# Patient Record
Sex: Male | Born: 1980 | Race: White | Hispanic: No | Marital: Married | Smoking: Never smoker
Health system: Southern US, Community
[De-identification: ages and names within clinical notes are randomized; demographics above are authoritative.]

## PROBLEM LIST (undated history)

## (undated) DIAGNOSIS — J45909 Unspecified asthma, uncomplicated: Secondary | ICD-10-CM

## (undated) HISTORY — DX: Unspecified asthma, uncomplicated: J45.909

## (undated) HISTORY — PX: TONSILLECTOMY AND ADENOIDECTOMY: SUR1326

---

## 2008-06-18 HISTORY — PX: ANTERIOR CRUCIATE LIGAMENT REPAIR: SHX115

## 2012-10-01 ENCOUNTER — Encounter: Payer: Self-pay | Admitting: Family Medicine

## 2012-10-01 ENCOUNTER — Ambulatory Visit (INDEPENDENT_AMBULATORY_CARE_PROVIDER_SITE_OTHER): Payer: BC Managed Care – PPO | Admitting: Family Medicine

## 2012-10-01 VITALS — BP 130/80 | HR 80 | Temp 98.7°F | Resp 12 | Ht 70.25 in | Wt 273.0 lb

## 2012-10-01 DIAGNOSIS — D802 Selective deficiency of immunoglobulin A [IgA]: Secondary | ICD-10-CM

## 2012-10-01 DIAGNOSIS — J45909 Unspecified asthma, uncomplicated: Secondary | ICD-10-CM

## 2012-10-01 DIAGNOSIS — F411 Generalized anxiety disorder: Secondary | ICD-10-CM

## 2012-10-01 DIAGNOSIS — J029 Acute pharyngitis, unspecified: Secondary | ICD-10-CM

## 2012-10-01 DIAGNOSIS — J4520 Mild intermittent asthma, uncomplicated: Secondary | ICD-10-CM | POA: Insufficient documentation

## 2012-10-01 MED ORDER — SERTRALINE HCL 50 MG PO TABS: 50.0000 mg | ORAL_TABLET | Freq: Every day | ORAL | Status: DC

## 2012-10-01 MED ORDER — ALBUTEROL SULFATE HFA 108 (90 BASE) MCG/ACT IN AERS: 2.0000 | INHALATION_SPRAY | RESPIRATORY_TRACT | Status: DC | PRN

## 2012-10-01 NOTE — Patient Instructions (Addendum)
Viral Pharyngitis  Viral pharyngitis is a viral infection that produces redness, pain, and swelling (inflammation) of the throat. It can spread from person to person (contagious).  CAUSES  Viral pharyngitis is caused by inhaling a large amount of certain germs called viruses. Many different viruses cause viral pharyngitis.  SYMPTOMS  Symptoms of viral pharyngitis include:   Sore throat.   Tiredness.   Stuffy nose.   Low-grade fever.   Congestion.   Cough.  TREATMENT  Treatment includes rest, drinking plenty of fluids, and the use of over-the-counter medication (approved by your caregiver).  HOME CARE INSTRUCTIONS    Drink enough fluids to keep your urine clear or pale yellow.   Eat soft, cold foods such as ice cream, frozen ice pops, or gelatin dessert.   Gargle with warm salt water (1 tsp salt per 1 qt of water).   If over age 7, throat lozenges may be used safely.   Only take over-the-counter or prescription medicines for pain, discomfort, or fever as directed by your caregiver. Do not take aspirin.  To help prevent spreading viral pharyngitis to others, avoid:   Mouth-to-mouth contact with others.   Sharing utensils for eating and drinking.   Coughing around others.  SEEK MEDICAL CARE IF:    You are better in a few days, then become worse.   You have a fever or pain not helped by pain medicines.   There are any other changes that concern you.  Document Released: 03/14/2005 Document Revised: 08/27/2011 Document Reviewed: 08/10/2010  ExitCare Patient Information 2013 [employer].

## 2012-10-01 NOTE — Progress Notes (Signed)
  Subjective:    Patient ID: Jeffrey [name], male    DOB: Aug 09, 1980, 32 y.o.   MRN: [id number]  HPI Patient here to establish care History of reported mild intermittent asthma. Rarely takes Proventil. Requesting refill. He is a nonsmoker. Takes no regular medications. 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 Had more frequent infections in childhood  Acute issue of history of URI symptoms. Possible low-grade fever last night with chills. Had some nasal congestion, dry cough, sore throat, malaise, and body aches. Denies any nausea or vomiting  Patient relates frequent anxiety symptoms. Question related to job stress. Denies any depression issues. He has some recent episodes of acute anxiety with lightheadedness, palpitations and occasional sensation of chest tightness. This has never been related to exertion. He frequently runs 2-3 miles per day without any difficulty whatsoever. No history of syncope. Starting to have some stress issues affecting sleep. No excessive caffeine use. No regular alcohol use. Nonsmoker.  Family history significant for father died at 45 of unknown cause. Possibly related to MI versus aneurysm. Father also had prostate cancer history.   Patient is single. Occasional cigar use. Occasional alcohol use as above. Real estate attorney   Review of Systems  Constitutional: Negative for fever, chills, fatigue and unexpected weight change.  HENT: Positive for congestion and sinus pressure.   Respiratory: Negative for cough and shortness of breath.   Cardiovascular: Positive for palpitations. Negative for leg swelling.  Gastrointestinal: Negative for abdominal pain.       Objective:   Physical Exam  Constitutional: He appears well-developed and well-nourished.  HENT:  Right Ear: External ear normal.  Left Ear: External ear normal.  Minimal posterior pharynx erythema. No exudate.  Neck: Neck supple. No thyromegaly present.  Cardiovascular: Normal rate and regular  rhythm.   No murmur heard. Pulmonary/Chest: Effort normal and breath sounds normal. No respiratory distress. He has no wheezes. He has no rales.  Musculoskeletal: He exhibits no edema.  Lymphadenopathy:    He has no cervical adenopathy.  Skin: No rash noted.          Assessment & Plan:  #1 viral pharyngitis. Rapid strep negative. Treat symptomatically #2 mild intermittent asthma. Refill albuterol for as needed use #3 anxiety. Question situational. Doubt panic attacks. We discussed options. We've recommended regular exercise which he is currently doing. We discussed possible counseling which he's not interested in. He agreed to trial of sertraline 50 mg one daily and reassess 4 weeks. Reviewed possible side effects

## 2012-10-28 ENCOUNTER — Telehealth: Payer: Self-pay | Admitting: Family Medicine

## 2012-10-28 NOTE — Telephone Encounter (Signed)
Pt would like referral to be tested for ADD.  Pt will then make follow up appt w/ you.

## 2012-10-29 NOTE — Telephone Encounter (Signed)
Please advise 

## 2012-10-30 NOTE — Telephone Encounter (Signed)
Pt following up on request. Pls advise

## 2012-10-30 NOTE — Telephone Encounter (Signed)
I would recommend he get follow up with Dr Caralyn Guile (confirm that he is still doing ADD testing first)

## 2012-10-31 NOTE — Telephone Encounter (Signed)
[facility] does do ADD testing, it's all done on a computer and takes about 30 min.  This testing is only done ar the [place] location.  I was told the patient can call to schedule this testing, a referral can be done, but doesn't need to be.  I left pt a message on his personally identified VM, gave him their phone number.

## 2012-11-07 ENCOUNTER — Ambulatory Visit (INDEPENDENT_AMBULATORY_CARE_PROVIDER_SITE_OTHER): Payer: BC Managed Care – PPO | Admitting: Licensed Clinical Social Worker

## 2012-11-07 ENCOUNTER — Ambulatory Visit: Payer: BC Managed Care – PPO | Admitting: Family Medicine

## 2012-11-07 DIAGNOSIS — F988 Other specified behavioral and emotional disorders with onset usually occurring in childhood and adolescence: Secondary | ICD-10-CM

## 2012-11-14 ENCOUNTER — Encounter: Payer: Self-pay | Admitting: Family Medicine

## 2012-11-14 ENCOUNTER — Ambulatory Visit (INDEPENDENT_AMBULATORY_CARE_PROVIDER_SITE_OTHER): Payer: BC Managed Care – PPO | Admitting: Family Medicine

## 2012-11-14 VITALS — BP 140/84 | Temp 97.8°F | Wt 289.0 lb

## 2012-11-14 DIAGNOSIS — F988 Other specified behavioral and emotional disorders with onset usually occurring in childhood and adolescence: Secondary | ICD-10-CM | POA: Insufficient documentation

## 2012-11-14 MED ORDER — AMPHETAMINE-DEXTROAMPHETAMINE 20 MG PO TABS: 20.0000 mg | ORAL_TABLET | Freq: Two times a day (BID) | ORAL | Status: DC

## 2012-11-14 NOTE — Progress Notes (Signed)
  Subjective:    Patient ID: Jeffrey [name], male    DOB: 1980/11/05, 32 y.o.   MRN: [id number]  HPI Patient seen for followup ADD evaluation. Patient had recent evaluation which showed fairly severe ADHD. Patient completed law school recently that had issues all through school with focus and easy distractibility Frequently forgets things. Difficulty completing tasks. Difficulty staying focused. Has there been treated with medication. Starting had progressive job difficulties because of focus challenges  Currently takes albuterol as needed for mild intermittent asthma but no other medications. Never started sertraline and he felt a lot of his anxiety was related to ADD which had not been treated  Past Medical History  Diagnosis Date  . Asthma    Past Surgical History  Procedure Laterality Date  . Tonsillectomy and adenoidectomy      as a child  . Anterior cruciate ligament repair Right 2010    reports that he has never smoked. He does not have any smokeless tobacco history on file. His alcohol and drug histories are not on file. family history includes Cancer in his father; Heart disease in his father, maternal grandfather, maternal grandmother, paternal grandfather, and paternal grandmother; and Hypertension in his father. No Known Allergies    Review of Systems  Constitutional: Negative for fatigue.  Eyes: Negative for visual disturbance.  Respiratory: Negative for cough, chest tightness and shortness of breath.   Cardiovascular: Negative for chest pain, palpitations and leg swelling.  Neurological: Negative for dizziness, syncope, weakness, light-headedness and headaches.       Objective:   Physical Exam  Constitutional: He appears well-developed and well-nourished.  Cardiovascular: Normal rate and regular rhythm.  Exam reveals no gallop.   No murmur heard. Pulmonary/Chest: Effort normal and breath sounds normal. No respiratory distress. He has no wheezes. He has no  rales.  Psychiatric: He has a normal mood and affect. His behavior is normal.          Assessment & Plan:  ADD. Long discussion with patient regarding options. We agreed to trial of Adderall 20 mg twice a day. Reviewed possible side effects. Discussed controlled nature. Reassess one month

## 2012-11-14 NOTE — Patient Instructions (Addendum)
Attention Deficit Hyperactivity Disorder Attention deficit hyperactivity disorder (ADHD) is a problem with behavior issues based on the way the brain functions (neurobehavioral disorder). It is a common reason for behavior and academic problems in school. CAUSES  The cause of ADHD is unknown in most cases. It may run in families. It sometimes can be associated with learning disabilities and other behavioral problems. SYMPTOMS  There are 3 types of ADHD. The 3 types and some of the symptoms include:  Inattentive  Gets bored or distracted easily.  Loses or forgets things. Forgets to hand in homework.  Has trouble organizing or completing tasks.  Difficulty staying on task.  An inability to organize daily tasks and school work.  Leaving projects, chores, or homework unfinished.  Trouble paying attention or responding to details. Careless mistakes.  Difficulty following directions. Often seems like is not listening.  Dislikes activities that require sustained attention (like chores or homework).  Hyperactive-impulsive  Feels like it is impossible to sit still or stay in a seat. Fidgeting with hands and feet.  Trouble waiting turn.  Talking too much or out of turn. Interruptive.  Speaks or acts impulsively.  Aggressive, disruptive behavior.  Constantly busy or on the go, noisy.  Combined  Has symptoms of both of the above. Often children with ADHD feel discouraged about themselves and with school. They often perform well below their abilities in school. These symptoms can cause problems in home, school, and in relationships with peers. As children get older, the excess motor activities can calm down, but the problems with paying attention and staying organized persist. Most children do not outgrow ADHD but with good treatment can learn to cope with the symptoms. DIAGNOSIS  When ADHD is suspected, the diagnosis should be made by professionals trained in ADHD.  Diagnosis will  include:  Ruling out other reasons for the child's behavior.  The caregivers will check with the child's school and check their medical records.  They will talk to teachers and parents.  Behavior rating scales for the child will be filled out by those dealing with the child on a daily basis. A diagnosis is made only after all information has been considered. TREATMENT  Treatment usually includes behavioral treatment often along with medicines. It may include stimulant medicines. The stimulant medicines decrease impulsivity and hyperactivity and increase attention. Other medicines used include antidepressants and certain blood pressure medicines. Most experts agree that treatment for ADHD should address all aspects of the child's functioning. Treatment should not be limited to the use of medicines alone. Treatment should include structured classroom management. The parents must receive education to address rewarding good behavior, discipline, and limit-setting. Tutoring or behavioral therapy or both should be available for the child. If untreated, the disorder can have long-term serious effects into adolescence and adulthood. HOME CARE INSTRUCTIONS   Often with ADHD there is a lot of frustration among the family in dealing with the illness. There is often blame and anger that is not warranted. This is a life long illness. There is no way to prevent ADHD. In many cases, because the problem affects the family as a whole, the entire family may need help. A therapist can help the family find better ways to handle the disruptive behaviors and promote change. If the child is young, most of the therapist's work is with the parents. Parents will learn techniques for coping with and improving their child's behavior. Sometimes only the child with the ADHD needs counseling. Your caregivers can help   you make these decisions.  Children with ADHD may need help in organizing. Some helpful tips include:  Keep  routines the same every day from wake-up time to bedtime. Schedule everything. This includes homework and playtime. This should include outdoor and indoor recreation. Keep the schedule on the refrigerator or a bulletin board where it is frequently seen. Mark schedule changes as far in advance as possible.  Have a place for everything and keep everything in its place. This includes clothing, backpacks, and school supplies.  Encourage writing down assignments and bringing home needed books.  Offer your child a well-balanced diet. Breakfast is especially important for school performance. Children should avoid drinks with caffeine including:  Soft drinks.  Coffee.  Tea.  However, some older children (adolescents) may find these drinks helpful in improving their attention.  Children with ADHD need consistent rules that they can understand and follow. If rules are followed, give small rewards. Children with ADHD often receive, and expect, criticism. Look for good behavior and praise it. Set realistic goals. Give clear instructions. Look for activities that can foster success and self-esteem. Make time for pleasant activities with your child. Give lots of affection.  Parents are their children's greatest advocates. Learn as much as possible about ADHD. This helps you become a stronger and better advocate for your child. It also helps you educate your child's teachers and instructors if they feel inadequate in these areas. Parent support groups are often helpful. A national group with local chapters is called CHADD (Children and Adults with Attention Deficit Hyperactivity Disorder). PROGNOSIS  There is no cure for ADHD. Children with the disorder seldom outgrow it. Many find adaptive ways to accommodate the ADHD as they mature. SEEK MEDICAL CARE IF:  Your child has repeated muscle twitches, cough or speech outbursts.  Your child has sleep problems.  Your child has a marked loss of  appetite.  Your child develops depression.  Your child has new or worsening behavioral problems.  Your child develops dizziness.  Your child has a racing heart.  Your child has stomach pains.  Your child develops headaches. Document Released: 05/25/2002 Document Revised: 08/27/2011 Document Reviewed: 01/05/2008 ExitCare Patient Information 2014 [employer].  

## 2012-12-12 ENCOUNTER — Encounter: Payer: Self-pay | Admitting: Family Medicine

## 2012-12-12 ENCOUNTER — Ambulatory Visit (INDEPENDENT_AMBULATORY_CARE_PROVIDER_SITE_OTHER): Payer: BC Managed Care – PPO | Admitting: Family Medicine

## 2012-12-12 VITALS — BP 120/80 | HR 84 | Temp 98.4°F | Resp 20 | Wt 272.0 lb

## 2012-12-12 DIAGNOSIS — F988 Other specified behavioral and emotional disorders with onset usually occurring in childhood and adolescence: Secondary | ICD-10-CM

## 2012-12-12 NOTE — Progress Notes (Signed)
  Subjective:    Patient ID: Jeffrey [name], male    DOB: [birth year], 32 y.o.   MRN: [id number]  HPI Followup attention deficit disorder We initiated Adderall 20 mg twice daily last visit Patient's had tremendous improvement in terms particularly of job functioning. More organized. More fissure with work. Less distractibility. Denies any headaches, insomnia, or any other side effects He feels like he is mostly getting about 3-4 hour duration medication. No major appetite suppression.  He has lost some weight but he states he is attempting to lose weight with diet and exercise changes   Review of Systems  Respiratory: Negative for shortness of breath.   Cardiovascular: Negative for chest pain.  Neurological: Negative for headaches.       Objective:   Physical Exam  Constitutional: He appears well-developed and well-nourished.  Cardiovascular: Normal rate and regular rhythm.  Exam reveals no gallop.   No murmur heard. Pulmonary/Chest: Effort normal and breath sounds normal. No respiratory distress. He has no wheezes. He has no rales.          Assessment & Plan:  Attention deficit disorder. Improved. Continue Adderall 20 mg twice daily. At refills will prepare for 3 months

## 2012-12-15 ENCOUNTER — Ambulatory Visit: Payer: BC Managed Care – PPO | Admitting: Family Medicine

## 2012-12-16 ENCOUNTER — Telehealth: Payer: Self-pay | Admitting: Family Medicine

## 2012-12-16 NOTE — Telephone Encounter (Signed)
Pt needs new rx generic adderall 20 mg °

## 2012-12-16 NOTE — Telephone Encounter (Signed)
Please advise 

## 2012-12-17 MED ORDER — AMPHETAMINE-DEXTROAMPHET ER 20 MG PO CP24: 20.0000 mg | ORAL_CAPSULE | Freq: Two times a day (BID) | ORAL | Status: DC

## 2012-12-17 MED ORDER — AMPHETAMINE-DEXTROAMPHETAMINE 20 MG PO TABS: 20.0000 mg | ORAL_TABLET | Freq: Two times a day (BID) | ORAL | Status: DC

## 2012-12-17 NOTE — Telephone Encounter (Signed)
Refill for 3 months. 

## 2013-01-28 ENCOUNTER — Telehealth: Payer: Self-pay | Admitting: Family Medicine

## 2013-01-28 MED ORDER — AMPHETAMINE-DEXTROAMPHETAMINE 20 MG PO TABS: ORAL_TABLET | ORAL | Status: DC

## 2013-01-28 MED ORDER — AMPHETAMINE-DEXTROAMPHETAMINE 20 MG PO TABS: 20.0000 mg | ORAL_TABLET | Freq: Two times a day (BID) | ORAL | Status: DC

## 2013-01-28 NOTE — Telephone Encounter (Signed)
Will be at the front for pick up

## 2013-01-28 NOTE — Telephone Encounter (Signed)
Pt's script for ADDERALL was written incorrectly. Pt received only one correct script amphetamine-dextroamphetamine (ADDERALL) 20 MG tablet Pt received 2 scripts for: amphetamine-dextroamphetamine (ADDERALL XR) 20 MG 24 hr   Pt does not take this.  Pt did not realize it was wrong until he went today to pharm, so he has no pills left. Pt would like to know if he could get today, since he is out.

## 2013-02-26 ENCOUNTER — Telehealth: Payer: Self-pay | Admitting: Family Medicine

## 2013-02-26 NOTE — Telephone Encounter (Signed)
Patient's prior auth for Adderall XR 20mg  twice daily has been denied. Per his insurance, these are the covered options:  - a once daily dose of Adderall XR at a higher strength - twice daily dosing of Adderall XR if it is a combination of two capsules of different strengths - twice daily dosing of Adderall IR  Please advise.

## 2013-02-27 NOTE — Telephone Encounter (Signed)
We just wrote for the adderall 20 mg bid for 3 months in August.  We do have his med correct by our chart.

## 2013-02-27 NOTE — Telephone Encounter (Signed)
We will need to discuss rx options with patient.  Let him know INSURANCE is not covering his current med and we can get back in to offer changes.

## 2013-02-27 NOTE — Telephone Encounter (Signed)
I spoke with Jeffrey [name]. He states that he is not on Adderall XR 20mg  BID; that it was a mix up earlier with [place] and that he brought those rx back & obtained the rx for the IR. For some reason, the pharmacy still has it as XR. I will call them as well. However, patient states current rx is: Adderall IR 20mg , BID.

## 2013-04-23 ENCOUNTER — Telehealth: Payer: Self-pay | Admitting: Family Medicine

## 2013-04-23 NOTE — Telephone Encounter (Signed)
May refill by 04-29-13-which makes 3 months.

## 2013-04-23 NOTE — Telephone Encounter (Signed)
Pt needs new rx generic adderall 20 mg #60. Pt would like rxs for next 3 month

## 2013-04-23 NOTE — Telephone Encounter (Signed)
Last refill 01/28/13 #60 2 refill Last visit 12/12/12

## 2013-04-24 NOTE — Telephone Encounter (Signed)
Pt is aware rx is not ready

## 2013-04-29 MED ORDER — AMPHETAMINE-DEXTROAMPHETAMINE 20 MG PO TABS: 20.0000 mg | ORAL_TABLET | Freq: Two times a day (BID) | ORAL | Status: DC

## 2013-04-29 MED ORDER — AMPHETAMINE-DEXTROAMPHETAMINE 20 MG PO TABS: ORAL_TABLET | ORAL | Status: DC

## 2013-04-29 NOTE — Telephone Encounter (Signed)
Left message on pt VM that his RX is ready for pick up

## 2013-05-04 ENCOUNTER — Encounter (HOSPITAL_COMMUNITY): Payer: Self-pay | Admitting: Emergency Medicine

## 2013-05-04 ENCOUNTER — Emergency Department (HOSPITAL_COMMUNITY)
Admission: EM | Admit: 2013-05-04 | Discharge: 2013-05-04 | Disposition: A | Discharge status: Home / Self Care | Payer: BC Managed Care – PPO | Attending: Emergency Medicine | Admitting: Emergency Medicine

## 2013-05-04 ENCOUNTER — Emergency Department (HOSPITAL_COMMUNITY): Payer: BC Managed Care – PPO

## 2013-05-04 DIAGNOSIS — Z8781 Personal history of (healed) traumatic fracture: Secondary | ICD-10-CM | POA: Insufficient documentation

## 2013-05-04 DIAGNOSIS — R0609 Other forms of dyspnea: Secondary | ICD-10-CM | POA: Insufficient documentation

## 2013-05-04 DIAGNOSIS — J45909 Unspecified asthma, uncomplicated: Secondary | ICD-10-CM | POA: Insufficient documentation

## 2013-05-04 DIAGNOSIS — S0993XA Unspecified injury of face, initial encounter: Secondary | ICD-10-CM | POA: Insufficient documentation

## 2013-05-04 DIAGNOSIS — Y9389 Activity, other specified: Secondary | ICD-10-CM | POA: Insufficient documentation

## 2013-05-04 DIAGNOSIS — S0992XA Unspecified injury of nose, initial encounter: Secondary | ICD-10-CM

## 2013-05-04 DIAGNOSIS — R0989 Other specified symptoms and signs involving the circulatory and respiratory systems: Secondary | ICD-10-CM | POA: Insufficient documentation

## 2013-05-04 DIAGNOSIS — Z79899 Other long term (current) drug therapy: Secondary | ICD-10-CM | POA: Insufficient documentation

## 2013-05-04 DIAGNOSIS — W64XXXA Exposure to other animate mechanical forces, initial encounter: Secondary | ICD-10-CM | POA: Insufficient documentation

## 2013-05-04 DIAGNOSIS — Y92009 Unspecified place in unspecified non-institutional (private) residence as the place of occurrence of the external cause: Secondary | ICD-10-CM | POA: Insufficient documentation

## 2013-05-04 NOTE — ED Notes (Signed)
Patient was hit in the nose by his dog at home. Trouble breathing n the left nostril.

## 2013-05-04 NOTE — ED Provider Notes (Signed)
CSN: [id number]     Arrival date & time 05/04/13  4098 History  This chart was scribed for Santiago Glad, PA working with Richardean Canal, MD by Quintella Reichert, ED Scribe. This patient was seen in room WTR9/WTR9 and the patient's care was started at 7:52 PM.   Chief Complaint  Patient presents with  . Facial Injury    The history is provided by the patient. No language interpreter was used.    HPI Comments: Jeffrey [name] is a 32 y.o. male who presents to the Emergency Department complaining of a facial injury sustained pta when he was hit in the nose by the crown of his dog's head.  Pt states that he was bleeding initially but this has since resolved.  He denies LOC in the injury.  He states that he is having some trouble breathing out of the left nostril "almost like sinus congestion."  He also notes constant moderate pain in the nose.  Pt states that he fractured his skull as a child and wants to make sure there he has not sustained any bony injuries. He has not taken anything for pain prior to arrival   Past Medical History  Diagnosis Date  . Asthma     Past Surgical History  Procedure Laterality Date  . Tonsillectomy and adenoidectomy      as a child  . Anterior cruciate ligament repair Right 2010    Family History  Problem Relation Age of Onset  . Cancer Father     prostate  . Heart disease Father   . Hypertension Father   . Heart disease Maternal Grandmother   . Heart disease Maternal Grandfather   . Heart disease Paternal Grandmother   . Heart disease Paternal Grandfather     History  Substance Use Topics  . Smoking status: Never Smoker   . Smokeless tobacco: Not on file  . Alcohol Use: Yes     Comment: occasional     Review of Systems A complete 10 system review of systems was obtained and all systems are negative except as noted in the HPI and PMH.    Allergies  Phenergan  Home Medications   Current Outpatient Rx  Name  Route  Sig  Dispense  Refill   . amphetamine-dextroamphetamine (ADDERALL) 20 MG tablet      Take 1 tablet (20 mg total) by mouth 2 (two) times daily... May refill in two month.   60 tablet   0   . Multiple Vitamins-Minerals (MENS MULTI VITAMIN & MINERAL PO)   Oral   Take by mouth daily.         . naproxen sodium (ANAPROX) 220 MG tablet   Oral   Take 220 mg by mouth 2 (two) times daily with a meal.         . zinc gluconate 50 MG tablet   Oral   Take 50 mg by mouth daily.         Marland Kitchen albuterol (PROVENTIL HFA;VENTOLIN HFA) 108 (90 BASE) MCG/ACT inhaler   Inhalation   Inhale 2 puffs into the lungs every 4 (four) hours as needed for wheezing.   1 Inhaler   2    BP 149/89  Pulse 86  Temp(Src) 98.2 F (36.8 C) (Oral)  Wt 262 lb (118.842 kg)  SpO2 97%  Physical Exam  Nursing note and vitals reviewed. Constitutional: He appears well-developed and well-nourished.  HENT:  Head: Normocephalic and atraumatic.  Nose: No epistaxis.  Mouth/Throat: Oropharynx is clear  and moist. No oropharyngeal exudate.  Tenderness to palpation of the nasal bones. Nares patent bilaterally.  No swelling, bruising, or obvious deformity of the nose.  Eyes: EOM are normal. Pupils are equal, round, and reactive to light.  Neck: Normal range of motion. Neck supple.  Cardiovascular: Normal rate, regular rhythm and normal heart sounds.   No murmur heard. Pulmonary/Chest: Effort normal and breath sounds normal. He has no wheezes.  Musculoskeletal: Normal range of motion.  Neurological: He is alert.  Skin: Skin is warm and dry.  Psychiatric: He has a normal mood and affect. His behavior is normal.    ED Course  Procedures (including critical care time)  DIAGNOSTIC STUDIES: Oxygen Saturation is 97% on room air, normal by my interpretation.    COORDINATION OF CARE: 7:56 PM-Informed pt that imaging is negative for fracture.  Discussed treatment plan which includes ice and anti-inflammatories as needed with pt at bedside and pt  agreed to plan.    Labs Review Labs Reviewed - No data to display  Imaging Review Dg Facial Bones 1-2 Views  05/04/2013   CLINICAL DATA:  Injured nose.  EXAM: FACIAL BONES - 1-2 VIEW  COMPARISON:  None.  FINDINGS: No obvious nasal bone fractures identified. The paranasal sinuses are clear and the other facial bones appear normal.  IMPRESSION: No definite plain film findings for acute nasal bone fracture.   Electronically Signed   By: Loralie Champagne M.D.   On: 05/04/2013 19:41    EKG Interpretation   None       MDM  No diagnosis found. Patient presenting with nasal pain after his dog hit him in the nose with its head.  Xray of facial bones negative.  Nares patent.  No epistaxis.  Feel that the patient is stable for discharge.    I personally performed the services described in this documentation, which was scribed in my presence. The recorded information has been reviewed and is accurate.    Santiago Glad, PA-C 05/05/13 2325

## 2013-05-09 NOTE — ED Provider Notes (Signed)
Medical screening examination/treatment/procedure(s) were performed by non-physician practitioner and as supervising physician I was immediately available for consultation/collaboration.  EKG Interpretation   None         Khayla Koppenhaver H Jackqueline Aquilar, MD 05/09/13 1052 

## 2013-05-22 ENCOUNTER — Ambulatory Visit: Payer: BC Managed Care – PPO | Admitting: Family Medicine

## 2013-06-26 ENCOUNTER — Telehealth: Payer: Self-pay

## 2013-06-26 ENCOUNTER — Ambulatory Visit: Payer: BC Managed Care – PPO | Admitting: Family Medicine

## 2013-06-26 DIAGNOSIS — Z0289 Encounter for other administrative examinations: Secondary | ICD-10-CM

## 2013-06-26 NOTE — Telephone Encounter (Signed)
Called and left message informing patient that he missed his appointment today.

## 2013-09-22 ENCOUNTER — Telehealth: Payer: Self-pay | Admitting: Family Medicine

## 2013-09-22 NOTE — Telephone Encounter (Signed)
Pt req rx on amphetamine-dextroamphetamine (ADDERALL) 20 MG tablet ° °

## 2013-09-22 NOTE — Telephone Encounter (Signed)
Last visit 12/12/12 Last refill 04/29/13 #60 2 refills

## 2013-09-23 MED ORDER — AMPHETAMINE-DEXTROAMPHETAMINE 20 MG PO TABS: ORAL_TABLET | ORAL | Status: DC

## 2013-09-23 MED ORDER — AMPHETAMINE-DEXTROAMPHETAMINE 20 MG PO TABS: 20.0000 mg | ORAL_TABLET | Freq: Two times a day (BID) | ORAL | Status: DC

## 2013-09-23 NOTE — Telephone Encounter (Signed)
Refill but then needs office follow up.

## 2013-09-23 NOTE — Telephone Encounter (Signed)
Pt is aware that RX is ready for pick up . Pt set up appt.

## 2013-10-06 ENCOUNTER — Telehealth: Payer: Self-pay | Admitting: Family Medicine

## 2013-10-06 NOTE — Telephone Encounter (Signed)
[id number] - [place], [place] - [address] is requesting a re-fill on albuterol (PROVENTIL HFA;VENTOLIN HFA) 108 (90 BASE) MCG/ACT inhaler

## 2013-10-07 MED ORDER — ALBUTEROL SULFATE HFA 108 (90 BASE) MCG/ACT IN AERS: 2.0000 | INHALATION_SPRAY | RESPIRATORY_TRACT | Status: DC | PRN

## 2013-10-07 NOTE — Telephone Encounter (Signed)
Rx sent to pharmacy   

## 2013-10-23 ENCOUNTER — Ambulatory Visit: Payer: BC Managed Care – PPO | Admitting: Family Medicine

## 2013-10-30 ENCOUNTER — Encounter: Payer: Self-pay | Admitting: Family Medicine

## 2013-10-30 ENCOUNTER — Ambulatory Visit (INDEPENDENT_AMBULATORY_CARE_PROVIDER_SITE_OTHER): Payer: BC Managed Care – PPO | Admitting: Family Medicine

## 2013-10-30 VITALS — BP 130/80 | HR 98 | Temp 97.9°F | Wt 275.0 lb

## 2013-10-30 DIAGNOSIS — F988 Other specified behavioral and emotional disorders with onset usually occurring in childhood and adolescence: Secondary | ICD-10-CM

## 2013-10-30 DIAGNOSIS — F411 Generalized anxiety disorder: Secondary | ICD-10-CM

## 2013-10-30 DIAGNOSIS — F418 Other specified anxiety disorders: Secondary | ICD-10-CM

## 2013-10-30 MED ORDER — LORAZEPAM 0.5 MG PO TABS
0.5000 mg | ORAL_TABLET | Freq: Three times a day (TID) | ORAL | Status: DC | PRN
Start: 2013-10-30 — End: 2014-03-24

## 2013-10-30 NOTE — Progress Notes (Signed)
   Subjective:    Patient ID: Jeffrey [name], male    DOB: 03/15/1981, 33 y.o.   MRN: [id number]  HPI Followup regarding attention deficit disorder. He remains on Adderall 20 mg twice daily. He does not take this every day. This continues to work well. Other than occasional dry mouth no side effects.  He has gained a few more pounds. He has tremendous work stress and has not been exercising consistently. He feels extremely anxious at times. We have previously discussed possible use of sertraline that he was not interested in daily medication. He is dealing also with stress of taking care of his mother who has health problems and lives couple hours away. He frequently works over 12 hours per day. Denies depression  Past Medical History  Diagnosis Date  . Asthma    Past Surgical History  Procedure Laterality Date  . Tonsillectomy and adenoidectomy      as a child  . Anterior cruciate ligament repair Right 2010    reports that he has never smoked. He does not have any smokeless tobacco history on file. He reports that he drinks alcohol. He reports that he does not use illicit drugs. family history includes Cancer in his father; Heart disease in his father, maternal grandfather, maternal grandmother, paternal grandfather, and paternal grandmother; Hypertension in his father. Allergies  Allergen Reactions  . Phenergan [Promethazine Hcl] Nausea And Vomiting      Review of Systems  Constitutional: Negative for fatigue and unexpected weight change.  Eyes: Negative for visual disturbance.  Respiratory: Negative for cough, chest tightness and shortness of breath.   Cardiovascular: Negative for chest pain, palpitations and leg swelling.  Neurological: Negative for dizziness, syncope, weakness, light-headedness and headaches.  Psychiatric/Behavioral: Negative for dysphoric mood. The patient is nervous/anxious.        Objective:   Physical Exam  Constitutional: He appears well-developed and  well-nourished.  Cardiovascular: Normal rate and regular rhythm.   No murmur heard. Pulmonary/Chest: Effort normal and breath sounds normal. No respiratory distress. He has no wheezes. He has no rales.  Musculoskeletal: He exhibits no edema.  Psychiatric: He has a normal mood and affect. His behavior is normal.          Assessment & Plan:  #1 attention deficit disorder. Stable. Does not need medication refills at this time #2 situational stress. We discussed keeping a healthy margin for things like exercise and rest. We have not recommended for any regular medication. Increase exercise frequency.

## 2013-10-30 NOTE — Progress Notes (Signed)
Pre visit review using our clinic review tool, if applicable. No additional management support is needed unless otherwise documented below in the visit note. 

## 2013-11-23 ENCOUNTER — Ambulatory Visit (INDEPENDENT_AMBULATORY_CARE_PROVIDER_SITE_OTHER): Payer: BC Managed Care – PPO | Admitting: Family Medicine

## 2013-11-23 ENCOUNTER — Encounter: Payer: Self-pay | Admitting: Family Medicine

## 2013-11-23 VITALS — BP 120/82 | HR 73 | Temp 98.9°F | Ht 70.25 in | Wt 277.5 lb

## 2013-11-23 DIAGNOSIS — S060X0A Concussion without loss of consciousness, initial encounter: Secondary | ICD-10-CM

## 2013-11-23 NOTE — Progress Notes (Signed)
Pre visit review using our clinic review tool, if applicable. No additional management support is needed unless otherwise documented below in the visit note. 

## 2013-11-23 NOTE — Progress Notes (Signed)
   Subjective:    Patient ID: Jeffrey [name], male    DOB: 01-27-1981, 33 y.o.   MRN: [id number]  Head Injury  Associated symptoms include headaches. Pertinent negatives include no weakness.   Acute visit. Possible concussion. Saturday he was playing with his dog and they collided. Head-to-head contact. No loss of consciousness. Felt slightly lightheaded afterwards. Yesterday had some low-grade nausea without vomiting. Increase fatigue. Some bifrontal headaches today. Headache is dull and relatively mild. Nausea has resolved. Denies any focal neurologic symptoms. He has felt slightly slow cognitively been no confusion. No seizure.  He relates prior history of concussion in high school and felt somewhat similar then.  Past Medical History  Diagnosis Date  . Asthma    Past Surgical History  Procedure Laterality Date  . Tonsillectomy and adenoidectomy      as a child  . Anterior cruciate ligament repair Right 2010    reports that he has never smoked. He does not have any smokeless tobacco history on file. He reports that he drinks alcohol. He reports that he does not use illicit drugs. family history includes Cancer in his father; Heart disease in his father, maternal grandfather, maternal grandmother, paternal grandfather, and paternal grandmother; Hypertension in his father. Allergies  Allergen Reactions  . Phenergan [Promethazine Hcl] Nausea And Vomiting      Review of Systems  Constitutional: Negative for fever and chills.  HENT: Negative for ear pain.   Respiratory: Negative for shortness of breath.   Cardiovascular: Negative for chest pain.  Neurological: Positive for light-headedness and headaches. Negative for tremors, seizures, syncope and weakness.  Psychiatric/Behavioral: Positive for decreased concentration. Negative for confusion.       Objective:   Physical Exam  Constitutional: He is oriented to person, place, and time. He appears well-developed and  well-nourished.  HENT:  Head: Normocephalic and atraumatic.  Right Ear: External ear normal.  Left Ear: External ear normal.  Neck: Neck supple. No thyromegaly present.  Cardiovascular: Normal rate and regular rhythm.   Pulmonary/Chest: Effort normal and breath sounds normal. No respiratory distress. He has no wheezes. He has no rales.  Neurological: He is alert and oriented to person, place, and time. No cranial nerve deficit. Coordination normal.  No focal strength deficits  Psychiatric: He has a normal mood and affect. His behavior is normal.          Assessment & Plan:  Possible concussion. Patient has nonfocal exam neurologically at this time. We've recommended extra rest and avoidance of extreme physical exertion this time. Avoid any contact sports until symptoms fully resolved. Handout given.

## 2013-11-23 NOTE — Patient Instructions (Signed)

## 2014-02-25 ENCOUNTER — Telehealth: Payer: Self-pay | Admitting: Family Medicine

## 2014-02-25 MED ORDER — AMPHETAMINE-DEXTROAMPHETAMINE 20 MG PO TABS: 20.0000 mg | ORAL_TABLET | Freq: Two times a day (BID) | ORAL | Status: DC

## 2014-02-25 NOTE — Telephone Encounter (Signed)
Pt aware that RX is ready for pick up  

## 2014-02-25 NOTE — Telephone Encounter (Signed)
Refill OK

## 2014-02-25 NOTE — Telephone Encounter (Signed)
Pt request refill amphetamine-dextroamphetamine (ADDERALL) 20 MG tablet 3 mo supply  1/bid

## 2014-02-25 NOTE — Telephone Encounter (Signed)
Last visit 11/23/13 Last refill 09/23/13 #60 2 refill

## 2014-03-05 ENCOUNTER — Telehealth: Payer: Self-pay | Admitting: Family Medicine

## 2014-03-05 MED ORDER — AMOXICILLIN 500 MG PO TABS: ORAL_TABLET | ORAL | Status: DC

## 2014-03-05 NOTE — Telephone Encounter (Signed)
We can although current guidelines to not suggest in absence of valvular heart problem.  Amoxicillin 500 mg take 4 tablets one hour prior to procedure.

## 2014-03-05 NOTE — Telephone Encounter (Signed)
Pt is having root canal next Friday, and due to his lowered immune system, pt would like an abx prior to his procedure. Would like to know if you will do this for him? [facility]

## 2014-03-05 NOTE — Telephone Encounter (Signed)
Rx sent to pharmacy   

## 2014-03-23 ENCOUNTER — Telehealth: Payer: Self-pay | Admitting: Family Medicine

## 2014-03-23 NOTE — Telephone Encounter (Signed)
Pt needs refill on lorazepam call [facility] [address]

## 2014-03-23 NOTE — Telephone Encounter (Signed)
Last visit 11/23/13 Last refill 10/30/13 #30 1 refill

## 2014-03-23 NOTE — Telephone Encounter (Signed)
Refill once.  Avoid regular use. 

## 2014-03-24 MED ORDER — LORAZEPAM 0.5 MG PO TABS: 0.5000 mg | ORAL_TABLET | Freq: Three times a day (TID) | ORAL | Status: DC | PRN

## 2014-03-24 NOTE — Telephone Encounter (Signed)
Rx called in to pharmacy. 

## 2014-05-06 ENCOUNTER — Telehealth: Payer: Self-pay | Admitting: Family Medicine

## 2014-05-06 MED ORDER — AMOXICILLIN 500 MG PO TABS: ORAL_TABLET | ORAL | Status: DC

## 2014-05-06 MED ORDER — ALBUTEROL SULFATE HFA 108 (90 BASE) MCG/ACT IN AERS: 2.0000 | INHALATION_SPRAY | RESPIRATORY_TRACT | Status: DC | PRN

## 2014-05-06 NOTE — Telephone Encounter (Signed)
Pt request refill of the following: albuterol (PROVENTIL HFA;VENTOLIN HFA) 108 (90 BASE) MCG/ACT inhaler, amoxicillin (AMOXIL) 500 MG tablet   Phamacy:[facility] [address]     Phamacy:

## 2014-05-06 NOTE — Telephone Encounter (Signed)
RX sent to pharmacy  

## 2014-05-12 ENCOUNTER — Encounter: Payer: Self-pay | Admitting: Family Medicine

## 2014-05-12 ENCOUNTER — Ambulatory Visit (INDEPENDENT_AMBULATORY_CARE_PROVIDER_SITE_OTHER): Payer: BC Managed Care – PPO | Admitting: Family Medicine

## 2014-05-12 VITALS — BP 130/76 | HR 85 | Temp 97.6°F | Wt 266.0 lb

## 2014-05-12 DIAGNOSIS — J209 Acute bronchitis, unspecified: Secondary | ICD-10-CM

## 2014-05-12 MED ORDER — HYDROCODONE-HOMATROPINE 5-1.5 MG/5ML PO SYRP: 5.0000 mL | ORAL_SOLUTION | Freq: Four times a day (QID) | ORAL | Status: AC | PRN

## 2014-05-12 NOTE — Patient Instructions (Signed)

## 2014-05-12 NOTE — Progress Notes (Signed)
   Subjective:    Patient ID: Jeffrey [name], male    DOB: December 29, 1980, 33 y.o.   MRN: [id number]  HPI Patient is nonsmoker seen with cough with onset about 4-5 days ago. He thinks he may of had some subjective fever initially but none now. His cough is occasionally productive colored sputum. Had some nasal congestion. Body aches and malaise. Intermittent mild headaches. He has taken over-the-counter Robitussin type of cough syrup without much improvement.  Past Medical History  Diagnosis Date  . Asthma    Past Surgical History  Procedure Laterality Date  . Tonsillectomy and adenoidectomy      as a child  . Anterior cruciate ligament repair Right 2010    reports that he has never smoked. He does not have any smokeless tobacco history on file. He reports that he drinks alcohol. He reports that he does not use illicit drugs. family history includes Cancer in his father; Heart disease in his father, maternal grandfather, maternal grandmother, paternal grandfather, and paternal grandmother; Hypertension in his father. Allergies  Allergen Reactions  . Phenergan [Promethazine Hcl] Nausea And Vomiting     Review of Systems  Constitutional: Positive for fatigue. Negative for fever, chills and appetite change.  HENT: Positive for congestion. Negative for sore throat.   Respiratory: Positive for cough.   Gastrointestinal: Negative for nausea and vomiting.  Neurological: Positive for headaches.       Objective:   Physical Exam  Constitutional: He appears well-developed and well-nourished.  HENT:  Right Ear: External ear normal.  Left Ear: External ear normal.  Mouth/Throat: Oropharynx is clear and moist.  Neck: Neck supple.  Cardiovascular: Normal rate and regular rhythm.   Pulmonary/Chest: Effort normal and breath sounds normal. No respiratory distress. He has no wheezes. He has no rales.  Lymphadenopathy:    He has no cervical adenopathy.          Assessment & Plan:  Acute  bronchitis. Suspect viral. Treat symptomatically. Hycodan cough syrup 1 teaspoon daily at bedtime for nighttime cough. Follow-up as needed

## 2014-05-12 NOTE — Progress Notes (Signed)
Pre visit review using our clinic review tool, if applicable. No additional management support is needed unless otherwise documented below in the visit note. 

## 2014-06-23 ENCOUNTER — Telehealth: Payer: Self-pay | Admitting: Family Medicine

## 2014-06-23 MED ORDER — LORAZEPAM 0.5 MG PO TABS: 0.5000 mg | ORAL_TABLET | Freq: Three times a day (TID) | ORAL | Status: DC | PRN

## 2014-06-23 MED ORDER — AMPHETAMINE-DEXTROAMPHETAMINE 20 MG PO TABS
20.0000 mg | ORAL_TABLET | Freq: Two times a day (BID) | ORAL | Status: DC
Start: 2014-06-23 — End: 2014-11-04

## 2014-06-23 MED ORDER — AMPHETAMINE-DEXTROAMPHETAMINE 20 MG PO TABS: 20.0000 mg | ORAL_TABLET | Freq: Two times a day (BID) | ORAL | Status: DC

## 2014-06-23 NOTE — Telephone Encounter (Signed)
RX called into pharmacy. Left message on [facility] that Adderall is ready for pick up

## 2014-06-23 NOTE — Telephone Encounter (Signed)
Ativan Last refill 03/24/14 #30 0   Adderall  Last refill 02/25/14 #60 2  Last visit 05/12/14

## 2014-06-23 NOTE — Telephone Encounter (Signed)
Refill Adderall for 3 months.  Refill Ativan once- Avoid regular use.

## 2014-06-23 NOTE — Telephone Encounter (Signed)
Patient need a re-fill on LORazepam (ATIVAN) 0.5 MG tablet sent to [id number] [place], [place] - [address]. And on amphetamine-dextroamphetamine (ADDERALL) 20 MG tablet

## 2014-07-16 ENCOUNTER — Encounter: Payer: Self-pay | Admitting: Family Medicine

## 2014-07-16 ENCOUNTER — Ambulatory Visit (INDEPENDENT_AMBULATORY_CARE_PROVIDER_SITE_OTHER): Payer: BLUE CROSS/BLUE SHIELD | Admitting: Family Medicine

## 2014-07-16 VITALS — BP 124/78 | HR 69 | Temp 97.7°F | Ht 70.0 in | Wt 266.0 lb

## 2014-07-16 DIAGNOSIS — E669 Obesity, unspecified: Secondary | ICD-10-CM

## 2014-07-16 DIAGNOSIS — Z23 Encounter for immunization: Secondary | ICD-10-CM

## 2014-07-16 DIAGNOSIS — Z0000 Encounter for general adult medical examination without abnormal findings: Secondary | ICD-10-CM

## 2014-07-16 LAB — CBC WITH DIFFERENTIAL/PLATELET
Basophils Absolute: 0 10*3/uL (ref 0.0–0.1)
Basophils Relative: 0.5 % (ref 0.0–3.0)
EOS ABS: 0.6 10*3/uL (ref 0.0–0.7)
Eosinophils Relative: 8.8 % — ABNORMAL HIGH (ref 0.0–5.0)
HCT: 44.7 % (ref 39.0–52.0)
HEMOGLOBIN: 15.5 g/dL (ref 13.0–17.0)
Lymphocytes Relative: 31.4 % (ref 12.0–46.0)
Lymphs Abs: 2 10*3/uL (ref 0.7–4.0)
MCHC: 34.6 g/dL (ref 30.0–36.0)
MCV: 86 fl (ref 78.0–100.0)
Monocytes Absolute: 0.5 10*3/uL (ref 0.1–1.0)
Monocytes Relative: 7.4 % (ref 3.0–12.0)
NEUTROS PCT: 51.9 % (ref 43.0–77.0)
Neutro Abs: 3.4 10*3/uL (ref 1.4–7.7)
Platelets: 230 10*3/uL (ref 150.0–400.0)
RBC: 5.2 Mil/uL (ref 4.22–5.81)
RDW: 13 % (ref 11.5–15.5)
WBC: 6.5 10*3/uL (ref 4.0–10.5)

## 2014-07-16 LAB — LIPID PANEL
Cholesterol: 132 mg/dL (ref 0–200)
HDL: 37.4 mg/dL — AB (ref >39.00)
LDL Cholesterol: 63 mg/dL (ref 0–99)
NONHDL: 94.6
TRIGLYCERIDES: 160 mg/dL — AB (ref 0.0–149.0)
Total CHOL/HDL Ratio: 4
VLDL: 32 mg/dL (ref 0.0–40.0)

## 2014-07-16 LAB — BASIC METABOLIC PANEL
BUN: 9 mg/dL (ref 6–23)
CALCIUM: 9.4 mg/dL (ref 8.4–10.5)
CO2: 27 mEq/L (ref 19–32)
Chloride: 105 mEq/L (ref 96–112)
Creatinine, Ser: 0.64 mg/dL (ref 0.40–1.50)
GFR: 154.18 mL/min (ref >60.00)
Glucose, Bld: 87 mg/dL (ref 70–99)
Potassium: 3.9 mEq/L (ref 3.5–5.1)
Sodium: 141 mEq/L (ref 135–145)

## 2014-07-16 LAB — HEPATIC FUNCTION PANEL
ALBUMIN: 4.6 g/dL (ref 3.5–5.2)
ALK PHOS: 60 U/L (ref 39–117)
ALT: 12 U/L (ref 0–53)
AST: 14 U/L (ref 0–37)
Bilirubin, Direct: 0.2 mg/dL (ref 0.0–0.3)
Total Bilirubin: 0.7 mg/dL (ref 0.2–1.2)
Total Protein: 7.3 g/dL (ref 6.0–8.3)

## 2014-07-16 LAB — TSH: TSH: 1.25 u[IU]/mL (ref 0.35–4.50)

## 2014-07-16 NOTE — Progress Notes (Signed)
   Subjective:    Patient ID: Jeffrey [name], male    DOB: [birth year], 34 y.o.   MRN: [id number]  HPI Patient here for complete physical. He has become a vegetarian over the past year. He is conscious to get adequate protein. He's been doing some exercise and has lost a few pounds. Last tetanus 2005. Takes no prescription medications regularly other than Adderall for attention deficit disorder. He declines flu vaccine.  Past medical history of family history reviewed and as below with no changes  Past Medical History  Diagnosis Date  . Asthma    Past Surgical History  Procedure Laterality Date  . Tonsillectomy and adenoidectomy      as a child  . Anterior cruciate ligament repair Right 2010    reports that he has never smoked. He does not have any smokeless tobacco history on file. He reports that he drinks alcohol. He reports that he does not use illicit drugs. family history includes Cancer in his father; Heart disease in his father, maternal grandfather, maternal grandmother, paternal grandfather, and paternal grandmother; Hypertension in his father. Allergies  Allergen Reactions  . Phenergan [Promethazine Hcl] Nausea And Vomiting      Review of Systems  Constitutional: Negative for fever, activity change, appetite change and fatigue.  HENT: Negative for congestion, ear pain and trouble swallowing.   Eyes: Negative for pain and visual disturbance.  Respiratory: Negative for cough, shortness of breath and wheezing.   Cardiovascular: Negative for chest pain and palpitations.  Gastrointestinal: Negative for nausea, vomiting, abdominal pain, diarrhea, constipation, blood in stool, abdominal distention and rectal pain.  Genitourinary: Negative for dysuria, hematuria and testicular pain.  Musculoskeletal: Negative for joint swelling and arthralgias.  Skin: Negative for rash.  Neurological: Negative for dizziness, syncope and headaches.  Hematological: Negative for adenopathy.    Psychiatric/Behavioral: Negative for confusion and dysphoric mood.       Objective:   Physical Exam  Constitutional: He is oriented to person, place, and time. He appears well-developed and well-nourished. No distress.  HENT:  Head: Normocephalic and atraumatic.  Right Ear: External ear normal.  Left Ear: External ear normal.  Mouth/Throat: Oropharynx is clear and moist.  Eyes: Conjunctivae and EOM are normal. Pupils are equal, round, and reactive to light.  Neck: Normal range of motion. Neck supple. No thyromegaly present.  Cardiovascular: Normal rate, regular rhythm and normal heart sounds.   No murmur heard. Pulmonary/Chest: No respiratory distress. He has no wheezes. He has no rales.  Abdominal: Soft. Bowel sounds are normal. He exhibits no distension and no mass. There is no tenderness. There is no rebound and no guarding.  Musculoskeletal: He exhibits no edema.  Lymphadenopathy:    He has no cervical adenopathy.  Neurological: He is alert and oriented to person, place, and time. He displays normal reflexes. No cranial nerve deficit.  Skin: No rash noted.  Psychiatric: He has a normal mood and affect.          Assessment & Plan:  Complete physical. We've encouraged continued weight loss. Tetanus booster given. Obtain screening lab work.

## 2014-07-16 NOTE — Progress Notes (Signed)
Pre visit review using our clinic review tool, if applicable. No additional management support is needed unless otherwise documented below in the visit note. 

## 2014-09-20 ENCOUNTER — Telehealth: Payer: Self-pay | Admitting: Family Medicine

## 2014-09-20 NOTE — Telephone Encounter (Signed)
Refill albuterol OK.  Should not be taking Ativan except rarely for severe anxiety.  Refill #20 only

## 2014-09-20 NOTE — Telephone Encounter (Signed)
Ativan  Last visit 07/16/14 Last refill 06/23/14 #30 0 refill

## 2014-09-20 NOTE — Telephone Encounter (Addendum)
Pt needs refills on albuterol inhaler and ativan call [facility] [address].

## 2014-09-21 MED ORDER — ALBUTEROL SULFATE HFA 108 (90 BASE) MCG/ACT IN AERS: 2.0000 | INHALATION_SPRAY | RESPIRATORY_TRACT | Status: DC | PRN

## 2014-09-21 MED ORDER — LORAZEPAM 0.5 MG PO TABS: 0.5000 mg | ORAL_TABLET | Freq: Three times a day (TID) | ORAL | Status: DC | PRN

## 2014-09-21 NOTE — Telephone Encounter (Signed)
Rx sent and called into pharmacy

## 2014-11-03 ENCOUNTER — Telehealth: Payer: Self-pay | Admitting: Family Medicine

## 2014-11-03 NOTE — Telephone Encounter (Signed)
Last visit 07/16/14 Last refill 06/23/14 #60 2 refills

## 2014-11-03 NOTE — Telephone Encounter (Signed)
Refill OK

## 2014-11-03 NOTE — Telephone Encounter (Signed)
Pt request refill of the following: amphetamine-dextroamphetamine (ADDERALL) 20 MG tablet ° ° °Phamacy: ° °

## 2014-11-04 MED ORDER — AMPHETAMINE-DEXTROAMPHETAMINE 20 MG PO TABS: 20.0000 mg | ORAL_TABLET | Freq: Two times a day (BID) | ORAL | Status: DC

## 2014-11-04 NOTE — Addendum Note (Signed)
Addended by: Shelby DubinFLOYD, Rylee Nuzum E on: 11/04/2014 10:09 AM   Modules accepted: Orders

## 2014-11-04 NOTE — Telephone Encounter (Signed)
Left message on Vm that Rx is ready for pickup  

## 2015-01-31 ENCOUNTER — Telehealth: Payer: Self-pay | Admitting: Family Medicine

## 2015-01-31 NOTE — Telephone Encounter (Signed)
Last visit 07/16/14 Last refill 09/21/14 #20 0 refill

## 2015-01-31 NOTE — Telephone Encounter (Signed)
Pt request refill LORazepam (ATIVAN) 0.5 MG tablet [facility] [address]

## 2015-01-31 NOTE — Telephone Encounter (Signed)
Refill once.  Avoid regular use. 

## 2015-02-01 MED ORDER — LORAZEPAM 0.5 MG PO TABS: 0.5000 mg | ORAL_TABLET | Freq: Three times a day (TID) | ORAL | Status: DC | PRN

## 2015-02-01 NOTE — Telephone Encounter (Signed)
Rx faxed to pharmacy  

## 2015-02-04 ENCOUNTER — Encounter: Payer: Self-pay | Admitting: Family Medicine

## 2015-02-04 ENCOUNTER — Ambulatory Visit (INDEPENDENT_AMBULATORY_CARE_PROVIDER_SITE_OTHER): Payer: BLUE CROSS/BLUE SHIELD | Admitting: Family Medicine

## 2015-02-04 VITALS — BP 130/80 | HR 92 | Temp 97.6°F | Wt 253.0 lb

## 2015-02-04 DIAGNOSIS — E669 Obesity, unspecified: Secondary | ICD-10-CM | POA: Diagnosis not present

## 2015-02-04 NOTE — Progress Notes (Signed)
Pre visit review using our clinic review tool, if applicable. No additional management support is needed unless otherwise documented below in the visit note. 

## 2015-02-04 NOTE — Progress Notes (Signed)
   Subjective:    Patient ID: Jeffrey [name], male    DOB: [birth year], 34 y.o.   MRN: [id number]  HPI Patient here to discuss the fact his mother was recently diagnosed with nonalcoholic cirrhosis. She apparently has fatty liver disease. Patient had hepatic panel back in January that was completely normal. No history of elevated liver transaminases. He has scaled back alcohol tremendously and rarely drinks alcohol. He has been losing some weight and is currently training for half marathon. He has become a vegetarian. He is very conscious regarding dietary intake of high saturated and trans-fats  Past Medical History  Diagnosis Date  . Asthma    Past Surgical History  Procedure Laterality Date  . Tonsillectomy and adenoidectomy      as a child  . Anterior cruciate ligament repair Right 2010    reports that he has never smoked. He does not have any smokeless tobacco history on file. He reports that he drinks alcohol. He reports that he does not use illicit drugs. family history includes Cancer in his father; Heart disease in his father, maternal grandfather, maternal grandmother, paternal grandfather, and paternal grandmother; Hypertension in his father. Allergies  Allergen Reactions  . Phenergan [Promethazine Hcl] Nausea And Vomiting      Review of Systems  Constitutional: Negative for appetite change and unexpected weight change.  Respiratory: Negative for shortness of breath.   Gastrointestinal: Negative for nausea, vomiting and abdominal pain.       Objective:   Physical Exam  Constitutional: He appears well-developed and well-nourished.  Cardiovascular: Normal rate and regular rhythm.   Pulmonary/Chest: Effort normal and breath sounds normal. No respiratory distress. He has no wheezes. He has no rales.  Abdominal: Soft. Bowel sounds are normal. He exhibits no distension and no mass. There is no tenderness. There is no rebound and no guarding.  Hepatomegaly or splenomegaly  noted          Assessment & Plan:  Obesity. Family history of nonalcoholic fatty liver cirrhosis. We discussed prevention. He is strongly encouraged to lose weight and particularly try to lose some inches around his waist. Continue yearly monitoring of hepatic function

## 2015-02-04 NOTE — Patient Instructions (Signed)
Fatty Liver  Fatty liver is the accumulation of fat in liver cells. It is also called hepatosteatosis or steatohepatitis. It is normal for your liver to contain some fat. If fat is more than 5 to 10% of your liver's weight, you have fatty liver.   There are often no symptoms (problems) for years while damage is still occurring. People often learn about their fatty liver when they have medical tests for other reasons. Fat can damage your liver for years or even decades without causing problems. When it becomes severe, it can cause fatigue, weight loss, weakness, and confusion.  This makes you more likely to develop more serious liver problems. The liver is the largest organ in the body. It does a lot of work and often gives no warning signs when it is sick until late in a disease.  The liver has many important jobs including:  · Breaking down foods.  · Storing vitamins, iron, and other minerals.  · Making proteins.  · Making bile for food digestion.  · Breaking down many products including medications, alcohol and some poisons.  CAUSES   There are a number of different conditions, medications, and poisons that can cause a fatty liver. Eating too many calories causes fat to build up in the liver. Not processing and breaking fats down normally may also cause this. Certain conditions, such as obesity, diabetes, and high triglycerides also cause this. Most fatty liver patients tend to be middle-aged and over weight.   Some causes of fatty liver are:  · Alcohol over consumption.  · Malnutrition.  · Steroid use.  · Valproic acid toxicity.  · Obesity.  · Cushing's syndrome.  · Poisons.  · Tetracycline in high dosages.  · Pregnancy.  · Diabetes.  · Hyperlipidemia.  · Rapid weight loss.  Some people develop fatty liver even having none of these conditions.  SYMPTOMS   Fatty liver most often causes no problems. This is called asymptomatic.  · It can be diagnosed with blood tests and also by a liver biopsy.  · It is one of the  most common causes of minor elevations of liver enzymes on routine blood tests.  · Specialized Imaging of the liver using ultrasound, CT (computed tomography) scan, or MRI (magnetic resonance imaging) can suggest a fatty liver but a biopsy is needed to confirm it.  · A biopsy involves taking a small sample of liver tissue. This is done by using a needle. It is then looked at under a microscope by a specialist.  TREATMENT   It is important to treat the cause. Simple fatty liver without a medical reason may not need treatment.  · Weight loss, fat restriction, and exercise in overweight patients produces inconsistent results but is worth trying.  · Fatty liver due to alcohol toxicity may not improve even with stopping drinking.  · Good control of diabetes may reduce fatty liver.  · Lower your triglycerides through diet, medication or both.  · Eat a balanced, healthy diet.  · Increase your physical activity.  · Get regular checkups from a liver specialist.  · There are no medical or surgical treatments for a fatty liver or NASH, but improving your diet and increasing your exercise may help prevent or reverse some of the damage.  PROGNOSIS   Fatty liver may cause no damage or it can lead to an inflammation of the liver. This is, called steatohepatitis. When it is linked to alcohol abuse, it is called alcoholic steatohepatitis. It often   is not linked to alcohol. It is then called nonalcoholic steatohepatitis, or NASH. Over time the liver may become scarred and hardened. This condition is called cirrhosis. Cirrhosis is serious and may lead to liver failure or cancer. NASH is one of the leading causes of cirrhosis. About 10-20% of Americans have fatty liver and a smaller 2-5% has NASH.  Document Released: 07/20/2005 Document Revised: 08/27/2011 Document Reviewed: 10/14/2013  [employer]® Patient Information ©2015 [employer]. This information is not intended to replace advice given to you by your health care provider. Make  sure you discuss any questions you have with your health care provider.

## 2015-03-11 ENCOUNTER — Encounter: Payer: Self-pay | Admitting: Family Medicine

## 2015-03-11 ENCOUNTER — Ambulatory Visit (INDEPENDENT_AMBULATORY_CARE_PROVIDER_SITE_OTHER): Payer: BLUE CROSS/BLUE SHIELD | Admitting: Family Medicine

## 2015-03-11 VITALS — BP 120/80 | HR 77 | Temp 98.2°F | Ht 70.0 in | Wt 250.8 lb

## 2015-03-11 DIAGNOSIS — F411 Generalized anxiety disorder: Secondary | ICD-10-CM | POA: Diagnosis not present

## 2015-03-11 DIAGNOSIS — R42 Dizziness and giddiness: Secondary | ICD-10-CM | POA: Diagnosis not present

## 2015-03-11 NOTE — Progress Notes (Signed)
Pre visit review using our clinic review tool, if applicable. No additional management support is needed unless otherwise documented below in the visit note. 

## 2015-03-11 NOTE — Patient Instructions (Signed)
Follow up for any exertional chest pain or shortness of breath.

## 2015-03-11 NOTE — Progress Notes (Signed)
   Subjective:    Patient ID: Jeffrey [name], male    DOB: 07/04/1980, 34 y.o.   MRN: [id number]  HPI   Patient seen with recent episode of dizziness Tuesday. He went to work out at lunch time and felt somewhat sweaty and nauseous and had what he described as transient vertigo. His symptoms lasted about 4 to5 hours. He then felt very anxious afterwards went home took lorazepam which seemed to help. He's had no episodes since then. He has not had any recent exertional chest pains. He has been losing some weight which he attributes to dietary changes and exercise. He had several labs back in the winter with normal CBC and TSH. He's not had any recent focal weakness. No ataxia. He does have history of probable panic attacks in the past. He thinks this may have represented what happened Tuesday. He has exercised couple times since then without difficulty.  Past Medical History  Diagnosis Date  . Asthma    Past Surgical History  Procedure Laterality Date  . Tonsillectomy and adenoidectomy      as a child  . Anterior cruciate ligament repair Right 2010    reports that he has never smoked. He does not have any smokeless tobacco history on file. He reports that he drinks alcohol. He reports that he does not use illicit drugs. family history includes Cancer in his father; Heart disease in his father, maternal grandfather, maternal grandmother, paternal grandfather, and paternal grandmother; Hypertension in his father. Allergies  Allergen Reactions  . Phenergan [Promethazine Hcl] Nausea And Vomiting     Review of Systems  Constitutional: Negative for fatigue.  Eyes: Negative for visual disturbance.  Respiratory: Negative for cough, chest tightness and shortness of breath.   Cardiovascular: Negative for chest pain, palpitations and leg swelling.  Neurological: Negative for dizziness, syncope, weakness, light-headedness and headaches.       Objective:   Physical Exam  Constitutional: He is  oriented to person, place, and time. He appears well-developed and well-nourished.  Eyes: Pupils are equal, round, and reactive to light.  Neck: Neck supple.  Cardiovascular: Normal rate and regular rhythm.  Exam reveals no gallop.   No murmur heard. Pulmonary/Chest: Effort normal and breath sounds normal. No respiratory distress. He has no wheezes. He has no rales.  Neurological: He is alert and oriented to person, place, and time. He has normal reflexes. No cranial nerve deficit. Coordination normal.          Assessment & Plan:  Transient dizziness. He had other associated symptoms above which may have been anxiety related. He has had no episodes since then. We recommended observation. If he has more frequent episodes of anxiety with suggest SSRI medications such as sertraline.

## 2015-04-26 ENCOUNTER — Telehealth: Payer: Self-pay | Admitting: Family Medicine

## 2015-04-26 MED ORDER — ALBUTEROL SULFATE HFA 108 (90 BASE) MCG/ACT IN AERS: 2.0000 | INHALATION_SPRAY | RESPIRATORY_TRACT | Status: DC | PRN

## 2015-04-26 MED ORDER — AMOXICILLIN 500 MG PO CAPS: ORAL_CAPSULE | ORAL | Status: DC

## 2015-04-26 NOTE — Telephone Encounter (Signed)
Pt needs generic albuterol inhaler send [facility] [address]. Pt has dental appt for cleaning and needs amoxiclillin due to low IGA. [facility] [address]

## 2015-04-26 NOTE — Telephone Encounter (Signed)
May refill albuterol inhaler Amoxicillin 500 milligrams 4 tablets one hour prior to dental procedure

## 2015-04-26 NOTE — Telephone Encounter (Signed)
Both medications have been sent in for patient.

## 2015-06-07 ENCOUNTER — Telehealth: Payer: Self-pay | Admitting: Family Medicine

## 2015-06-07 MED ORDER — AMPHETAMINE-DEXTROAMPHETAMINE 20 MG PO TABS: 20.0000 mg | ORAL_TABLET | Freq: Two times a day (BID) | ORAL | Status: DC

## 2015-06-07 NOTE — Telephone Encounter (Signed)
RX printed for signature. 

## 2015-06-07 NOTE — Telephone Encounter (Signed)
Pt needs new rx generic addderall 20 mg

## 2015-06-08 NOTE — Telephone Encounter (Signed)
Pt is aware via voicemail that RX is up front to be picked up. 

## 2015-06-10 ENCOUNTER — Telehealth: Payer: Self-pay | Admitting: Family Medicine

## 2015-06-10 MED ORDER — AMPHETAMINE-DEXTROAMPHETAMINE 20 MG PO TABS: 20.0000 mg | ORAL_TABLET | Freq: Two times a day (BID) | ORAL | Status: DC

## 2015-06-10 NOTE — Telephone Encounter (Signed)
Rx ready for pick up and patient is aware 

## 2015-06-10 NOTE — Telephone Encounter (Signed)
Pt picked up his rx for amphetamine-dextroamphetamine (ADDERALL) 20 MG tablet pharmacy told him the rx could not be refill for 2 months. Pt call to ask why and said he is out of the med.

## 2015-07-12 ENCOUNTER — Other Ambulatory Visit (INDEPENDENT_AMBULATORY_CARE_PROVIDER_SITE_OTHER): Payer: BLUE CROSS/BLUE SHIELD

## 2015-07-12 DIAGNOSIS — Z0000 Encounter for general adult medical examination without abnormal findings: Secondary | ICD-10-CM

## 2015-07-12 LAB — HEPATIC FUNCTION PANEL
ALK PHOS: 45 U/L (ref 39–117)
ALT: 7 U/L (ref 0–53)
AST: 11 U/L (ref 0–37)
Albumin: 4.5 g/dL (ref 3.5–5.2)
BILIRUBIN DIRECT: 0.2 mg/dL (ref 0.0–0.3)
BILIRUBIN TOTAL: 0.7 mg/dL (ref 0.2–1.2)
Total Protein: 7.1 g/dL (ref 6.0–8.3)

## 2015-07-12 LAB — CBC WITH DIFFERENTIAL/PLATELET
BASOS ABS: 0 10*3/uL (ref 0.0–0.1)
Basophils Relative: 0.8 % (ref 0.0–3.0)
EOS ABS: 0.5 10*3/uL (ref 0.0–0.7)
Eosinophils Relative: 7.4 % — ABNORMAL HIGH (ref 0.0–5.0)
HCT: 45 % (ref 39.0–52.0)
Hemoglobin: 15 g/dL (ref 13.0–17.0)
LYMPHS ABS: 2.1 10*3/uL (ref 0.7–4.0)
LYMPHS PCT: 33.1 % (ref 12.0–46.0)
MCHC: 33.3 g/dL (ref 30.0–36.0)
MCV: 88.1 fl (ref 78.0–100.0)
MONO ABS: 0.5 10*3/uL (ref 0.1–1.0)
Monocytes Relative: 8.2 % (ref 3.0–12.0)
NEUTROS ABS: 3.2 10*3/uL (ref 1.4–7.7)
NEUTROS PCT: 50.5 % (ref 43.0–77.0)
PLATELETS: 240 10*3/uL (ref 150.0–400.0)
RBC: 5.11 Mil/uL (ref 4.22–5.81)
RDW: 13.5 % (ref 11.5–15.5)
WBC: 6.4 10*3/uL (ref 4.0–10.5)

## 2015-07-12 LAB — BASIC METABOLIC PANEL
BUN: 12 mg/dL (ref 6–23)
CALCIUM: 9.4 mg/dL (ref 8.4–10.5)
CO2: 29 meq/L (ref 19–32)
Chloride: 104 mEq/L (ref 96–112)
Creatinine, Ser: 0.67 mg/dL (ref 0.40–1.50)
GFR: 145.33 mL/min (ref >60.00)
GLUCOSE: 63 mg/dL — AB (ref 70–99)
Potassium: 4.1 mEq/L (ref 3.5–5.1)
Sodium: 141 mEq/L (ref 135–145)

## 2015-07-12 LAB — LIPID PANEL
CHOL/HDL RATIO: 3
Cholesterol: 114 mg/dL (ref 0–200)
HDL: 40.7 mg/dL (ref >39.00)
LDL CALC: 56 mg/dL (ref 0–99)
NonHDL: 73.35
Triglycerides: 88 mg/dL (ref 0.0–149.0)
VLDL: 17.6 mg/dL (ref 0.0–40.0)

## 2015-07-12 LAB — TSH: TSH: 1.1 u[IU]/mL (ref 0.35–4.50)

## 2015-07-19 ENCOUNTER — Ambulatory Visit (INDEPENDENT_AMBULATORY_CARE_PROVIDER_SITE_OTHER): Payer: BLUE CROSS/BLUE SHIELD | Admitting: Family Medicine

## 2015-07-19 ENCOUNTER — Encounter: Payer: Self-pay | Admitting: Family Medicine

## 2015-07-19 VITALS — BP 120/70 | HR 92 | Temp 97.7°F | Ht 70.0 in | Wt 218.3 lb

## 2015-07-19 DIAGNOSIS — Z0000 Encounter for general adult medical examination without abnormal findings: Secondary | ICD-10-CM | POA: Diagnosis not present

## 2015-07-19 MED ORDER — BUPROPION HCL ER (XL) 150 MG PO TB24: 150.0000 mg | ORAL_TABLET | Freq: Every day | ORAL | Status: DC

## 2015-07-19 MED ORDER — LORAZEPAM 0.5 MG PO TABS: 0.5000 mg | ORAL_TABLET | Freq: Three times a day (TID) | ORAL | Status: DC | PRN

## 2015-07-19 NOTE — Progress Notes (Signed)
Pre visit review using our clinic review tool, if applicable. No additional management support is needed unless otherwise documented below in the visit note. 

## 2015-07-19 NOTE — Progress Notes (Signed)
Subjective:    Patient ID: Jeffrey [name], male    DOB: [birth year], 35 y.o.   MRN: [id number]  HPI  patient seen for physical exam. History of excellent job this year with exercise and weight loss. He has history of attention deficit disorder. He takes Adderall. He does wife are trying to get pregnant. He thinks that Adderall may be causing some decline in libido. No reported issues with erectile dysfunction. Nonsmoker. Overall feels well. He would like to consider non-stimulant options for ADD  No history of depression.   Tetanus up-to-date. Patient declines flu vaccination.  Past Medical History  Diagnosis Date  . Asthma    Past Surgical History  Procedure Laterality Date  . Tonsillectomy and adenoidectomy      as a child  . Anterior cruciate ligament repair Right 2010    reports that he has never smoked. He does not have any smokeless tobacco history on file. He reports that he drinks alcohol. He reports that he does not use illicit drugs. family history includes Cancer in his father; Heart disease in his father, maternal grandfather, maternal grandmother, paternal grandfather, and paternal grandmother; Hypertension in his father. Allergies  Allergen Reactions  . Phenergan [Promethazine Hcl] Nausea And Vomiting      Review of Systems  Constitutional: Negative for fever, activity change, appetite change and fatigue.  HENT: Negative for congestion, ear pain and trouble swallowing.   Eyes: Negative for pain and visual disturbance.  Respiratory: Negative for cough, shortness of breath and wheezing.   Cardiovascular: Negative for chest pain and palpitations.  Gastrointestinal: Negative for nausea, vomiting, abdominal pain, diarrhea, constipation, blood in stool, abdominal distention and rectal pain.  Genitourinary: Negative for dysuria, hematuria and testicular pain.  Musculoskeletal: Negative for joint swelling and arthralgias.  Skin: Negative for rash.  Neurological: Negative  for dizziness, syncope and headaches.  Hematological: Negative for adenopathy.  Psychiatric/Behavioral: Negative for confusion, sleep disturbance and dysphoric mood.       Objective:   Physical Exam  Constitutional: He is oriented to person, place, and time. He appears well-developed and well-nourished. No distress.  HENT:  Head: Normocephalic and atraumatic.  Right Ear: External ear normal.  Left Ear: External ear normal.  Mouth/Throat: Oropharynx is clear and moist.  Eyes: Conjunctivae and EOM are normal. Pupils are equal, round, and reactive to light.  Neck: Normal range of motion. Neck supple. No thyromegaly present.  Cardiovascular: Normal rate, regular rhythm and normal heart sounds.   No murmur heard. Pulmonary/Chest: No respiratory distress. He has no wheezes. He has no rales.  Abdominal: Soft. Bowel sounds are normal. He exhibits no distension and no mass. There is no tenderness. There is no rebound and no guarding.  Musculoskeletal: He exhibits no edema.  Lymphadenopathy:    He has no cervical adenopathy.  Neurological: He is alert and oriented to person, place, and time. He displays normal reflexes. No cranial nerve deficit.  Skin: No rash noted.  Psychiatric: He has a normal mood and affect.          Assessment & Plan:   Physical exam. Labs reviewed with no major concerns. Continue exercise and weight loss efforts. Flu vaccine recommended and declined. Tetanus up-to-date.  Regarding attention deficit disorder patient requesting exploration of non-stimulant options. We discussed medications such as Strattera- but explained that this could decreased libido as well. We agree to trial of Wellbutrin XL 150 mg 1 daily for 2-3 weeks then titrate up if tolerated well and not seeing  effects to 300 mg and after 3 weeks of that follow-up for further evaluation.   He is aware that this is an "off label " indication for Wellbutrin but his main interest is trying to get away from  medication that could impact libido

## 2015-07-19 NOTE — Patient Instructions (Signed)
Take Wellbutrin XL 150 mg one daily for 2 weeks and if tolerating well at that point may increase to 300 mg once daily (if needed for attention deficit)

## 2015-07-28 ENCOUNTER — Ambulatory Visit (INDEPENDENT_AMBULATORY_CARE_PROVIDER_SITE_OTHER): Payer: BLUE CROSS/BLUE SHIELD | Admitting: Family Medicine

## 2015-07-28 DIAGNOSIS — K648 Other hemorrhoids: Secondary | ICD-10-CM

## 2015-07-28 DIAGNOSIS — K921 Melena: Secondary | ICD-10-CM | POA: Diagnosis not present

## 2015-07-28 MED ORDER — HYDROCORTISONE ACETATE 30 MG RE SUPP: 1.0000 | Freq: Two times a day (BID) | RECTAL | Status: DC

## 2015-07-28 NOTE — Progress Notes (Signed)
Pre visit review using our clinic review tool, if applicable. No additional management support is needed unless otherwise documented below in the visit note. 

## 2015-07-28 NOTE — Patient Instructions (Signed)

## 2015-07-28 NOTE — Progress Notes (Signed)
   Subjective:    Patient ID: Jeffrey [name], male    DOB: April 02, 1981, 35 y.o.   MRN: [id number]  HPI Patient seen with bright red blood per rectum for the past 10 days He states he's had this fairly consistently with stools during this time. Has occasionally had bright blood with wiping. Denies any perianal pain. Rare constipation. No diarrhea. Occasional lower abdominal cramps. Good appetite. He has lost some weight during the past year but he has been making effort to lose weight and has changed his diet dramatically.  Recent lab with physical unremarkable. Hemoglobin 15.0 No family history of any colon issues.  No family history of colon cancer  Past Medical History  Diagnosis Date  . Asthma    Past Surgical History  Procedure Laterality Date  . Tonsillectomy and adenoidectomy      as a child  . Anterior cruciate ligament repair Right 2010    reports that he has never smoked. He does not have any smokeless tobacco history on file. He reports that he drinks alcohol. He reports that he does not use illicit drugs. family history includes Cancer in his father; Heart disease in his father, maternal grandfather, maternal grandmother, paternal grandfather, and paternal grandmother; Hypertension in his father. Allergies  Allergen Reactions  . Phenergan [Promethazine Hcl] Nausea And Vomiting      Review of Systems  Constitutional: Negative for appetite change and unexpected weight change.  Respiratory: Negative for shortness of breath.   Cardiovascular: Negative for chest pain.  Gastrointestinal: Positive for blood in stool. Negative for nausea, vomiting, abdominal pain and diarrhea.  Neurological: Negative for dizziness.       Objective:   Physical Exam  Constitutional: He appears well-developed and well-nourished.  Cardiovascular: Normal rate and regular rhythm.   Pulmonary/Chest: Effort normal and breath sounds normal. No respiratory distress. He has no wheezes. He has no  rales.  Abdominal: Soft. He exhibits no mass. There is no tenderness. There is no rebound and no guarding.  Genitourinary:  No external hemorrhoids. No visible anal fissure. Digital exam reveals minimal stool which is Hemoccult positive. No rectal masses palpated. Endoscopy performed. He has significant internal hemorrhoids and we did see bleeding from one of these with withdrawal of anoscope. No masses or other concerns noted.          Assessment & Plan:  Hematochezia very likely related internal hemorrhoid. Recent lab work unremarkable. He has not had any unintentional weight loss. Recommend hydrocortisone 30 mg suppository twice daily for the next 10 days. Increase hydration. Increase fiber intake. Avoid straining. Avoid aspirin. Touch base if persistent bleeding in 2 weeks

## 2015-07-29 ENCOUNTER — Telehealth: Payer: Self-pay | Admitting: Family Medicine

## 2015-07-29 NOTE — Telephone Encounter (Signed)
Recommend Anusol HC suppositories  -these are OTC.  They are lesser mg but should work OK.

## 2015-07-29 NOTE — Telephone Encounter (Signed)
Aware of annotations.

## 2015-07-29 NOTE — Telephone Encounter (Signed)
Pt is aware of results. 

## 2015-07-29 NOTE — Telephone Encounter (Signed)
Pt was seen on 07-28-15 and can not afford hydrocortisone cost is around 400.00 dollars. Pt would like something else [facility]

## 2015-11-08 ENCOUNTER — Telehealth: Payer: Self-pay | Admitting: Family Medicine

## 2015-11-08 NOTE — Telephone Encounter (Signed)
First, confirm if he went up to Wellbutrin 300 mg dose   We started at 150 but advised titration to 300 mg.  If he did try to 300 mg daily with no benefit in treating his ADD may refill the Adderall.  If he is taking Wellbutrin 150 mg once daily no need to taper off.

## 2015-11-08 NOTE — Telephone Encounter (Signed)
Pt would like to go back on his       amphetamine-dextroamphetamine (ADDERALL) 20 MG tablet  BID   Pt states the buPROPion (WELLBUTRIN XL) 150 MG 24 hr tablet is not helping at all. Pt also wants to know does he need to wean off this drug?  Will continue to take until he hears from dr

## 2015-11-08 NOTE — Telephone Encounter (Signed)
Tried calling pt with NA. Will call back in the morning.

## 2015-11-08 NOTE — Telephone Encounter (Signed)
Pt recently seen on 07/28/2015 for medication F/U. Please advise.

## 2015-11-09 MED ORDER — AMPHETAMINE-DEXTROAMPHETAMINE 20 MG PO TABS: 20.0000 mg | ORAL_TABLET | Freq: Two times a day (BID) | ORAL | Status: DC

## 2015-11-09 NOTE — Telephone Encounter (Signed)
Left message for patient to call back  

## 2015-11-09 NOTE — Telephone Encounter (Signed)
Rxs placed up front and pt is aware.

## 2015-11-09 NOTE — Telephone Encounter (Signed)
Spoke with pt, he reported weight gain and unable to concentrated even more with the 150mg  dose of wellbutrin--he does not want to titrate up on this due to already having these side effects. Please advise on Adderall refill or does he need to be seen?

## 2015-11-09 NOTE — Telephone Encounter (Signed)
Refill Adderall OK.

## 2015-12-05 ENCOUNTER — Telehealth: Payer: Self-pay | Admitting: Family Medicine

## 2015-12-05 NOTE — Telephone Encounter (Signed)
Pt need new Rx for Ativan   Pharm:  [facility] [address]

## 2015-12-05 NOTE — Telephone Encounter (Signed)
Medication: Ativan 0.5 mg tablet  Last seen: 07/28/15 Last refill:07/19/15  #20 - 0 refills   Okay to refill?

## 2015-12-05 NOTE — Telephone Encounter (Signed)
Refill once.  Avoid regular use. 

## 2015-12-06 ENCOUNTER — Other Ambulatory Visit: Payer: Self-pay | Admitting: *Deleted

## 2015-12-06 MED ORDER — LORAZEPAM 0.5 MG PO TABS: 0.5000 mg | ORAL_TABLET | Freq: Three times a day (TID) | ORAL | Status: DC | PRN

## 2015-12-06 NOTE — Telephone Encounter (Signed)
Left message on voicemail that rx is upfront and ready to be picked up

## 2015-12-06 NOTE — Telephone Encounter (Signed)
Rx is upfront and ready to be picked up. Left voicemail to make aware.

## 2016-01-15 DIAGNOSIS — S0991XA Unspecified injury of ear, initial encounter: Secondary | ICD-10-CM | POA: Diagnosis not present

## 2016-02-15 ENCOUNTER — Ambulatory Visit (INDEPENDENT_AMBULATORY_CARE_PROVIDER_SITE_OTHER): Payer: BLUE CROSS/BLUE SHIELD | Admitting: Family Medicine

## 2016-02-15 ENCOUNTER — Encounter: Payer: Self-pay | Admitting: Family Medicine

## 2016-02-15 VITALS — BP 120/78 | HR 95 | Temp 98.5°F | Ht 70.0 in | Wt 215.0 lb

## 2016-02-15 DIAGNOSIS — J029 Acute pharyngitis, unspecified: Secondary | ICD-10-CM

## 2016-02-15 DIAGNOSIS — J069 Acute upper respiratory infection, unspecified: Secondary | ICD-10-CM | POA: Diagnosis not present

## 2016-02-15 DIAGNOSIS — B9789 Other viral agents as the cause of diseases classified elsewhere: Secondary | ICD-10-CM

## 2016-02-15 LAB — POCT RAPID STREP A (OFFICE): RAPID STREP A SCREEN: NEGATIVE

## 2016-02-15 NOTE — Progress Notes (Signed)
Subjective:     Patient ID: Jeffrey [name], male   DOB: [birth year], 35 y.o.   MRN: [id number]  HPI Patient seen as a work in with one-day history of fatigue, body aches, dry cough, sore throat. Minimal nasal congestion. No fever. No chills. Couple of people at work and that similar symptoms. No nausea or vomiting. Denies any shortness of breath.  Past Medical History:  Diagnosis Date  . Asthma    Past Surgical History:  Procedure Laterality Date  . ANTERIOR CRUCIATE LIGAMENT REPAIR Right 2010  . TONSILLECTOMY AND ADENOIDECTOMY     as a child    reports that he has never smoked. He does not have any smokeless tobacco history on file. He reports that he drinks alcohol. He reports that he does not use drugs. family history includes Cancer in his father; Heart disease in his father, maternal grandfather, maternal grandmother, paternal grandfather, and paternal grandmother; Hypertension in his father. Allergies  Allergen Reactions  . Phenergan [Promethazine Hcl] Nausea And Vomiting     Review of Systems  Constitutional: Positive for fatigue. Negative for chills and fever.  HENT: Positive for congestion and sore throat.   Respiratory: Positive for cough.   Skin: Negative for rash.       Objective:   Physical Exam  Constitutional: He appears well-developed and well-nourished.  HENT:  Right Ear: External ear normal.  Left Ear: External ear normal.  Minimal posterior pharynx erythema. No exudate  Neck: Neck supple.  Cardiovascular: Normal rate and regular rhythm.   Pulmonary/Chest: Effort normal and breath sounds normal. No respiratory distress. He has no wheezes. He has no rales.  Lymphadenopathy:    He has no cervical adenopathy.       Assessment:     Viral URI with cough. Rapid strep negative.    Plan:     -Treat symptomatically -Increase hydration Tylenol or Advil as needed for fever, headaches, or body aches -Follow-up as needed  [name] W Cambreigh Dearing MD [place]  [facility]

## 2016-02-15 NOTE — Progress Notes (Signed)
Pre visit review using our clinic review tool, if applicable. No additional management support is needed unless otherwise documented below in the visit note. 

## 2016-02-15 NOTE — Patient Instructions (Signed)
Viral Infections °A viral infection can be caused by different types of viruses. Most viral infections are not serious and resolve on their own. However, some infections may cause severe symptoms and may lead to further complications. °SYMPTOMS °Viruses can frequently cause: °· Minor sore throat. °· Aches and pains. °· Headaches. °· Runny nose. °· Different types of rashes. °· Watery eyes. °· Tiredness. °· Cough. °· Loss of appetite. °· Gastrointestinal infections, resulting in nausea, vomiting, and diarrhea. °These symptoms do not respond to antibiotics because the infection is not caused by bacteria. However, you might catch a bacterial infection following the viral infection. This is sometimes called a "superinfection." Symptoms of such a bacterial infection may include: °· Worsening sore throat with pus and difficulty swallowing. °· Swollen neck glands. °· Chills and a high or persistent fever. °· Severe headache. °· Tenderness over the sinuses. °· Persistent overall ill feeling (malaise), muscle aches, and tiredness (fatigue). °· Persistent cough. °· Yellow, green, or brown mucus production with coughing. °HOME CARE INSTRUCTIONS  °· Only take over-the-counter or prescription medicines for pain, discomfort, diarrhea, or fever as directed by your caregiver. °· Drink enough water and fluids to keep your urine clear or pale yellow. Sports drinks can provide valuable electrolytes, sugars, and hydration. °· Get plenty of rest and maintain proper nutrition. Soups and broths with crackers or rice are fine. °SEEK IMMEDIATE MEDICAL CARE IF:  °· You have severe headaches, shortness of breath, chest pain, neck pain, or an unusual rash. °· You have uncontrolled vomiting, diarrhea, or you are unable to keep down fluids. °· You or your child has an oral temperature above 102° F (38.9° C), not controlled by medicine. °· Your baby is older than 3 months with a rectal temperature of 102° F (38.9° C) or higher. °· Your baby is 3  months old or younger with a rectal temperature of 100.4° F (38° C) or higher. °MAKE SURE YOU:  °· Understand these instructions. °· Will watch your condition. °· Will get help right away if you are not doing well or get worse. °  °This information is not intended to replace advice given to you by your health care provider. Make sure you discuss any questions you have with your health care provider. °  °Document Released: 03/14/2005 Document Revised: 08/27/2011 Document Reviewed: 11/10/2014 °Elsevier Interactive Patient Education ©2016 [employer] ° °

## 2016-03-15 ENCOUNTER — Telehealth: Payer: Self-pay | Admitting: Family Medicine

## 2016-03-15 NOTE — Telephone Encounter (Signed)
Pt seen 8/30 with congestion. Not given any meds. Pt states he is not better, has a cough, congestion, mucus, stopped up nose. Would like to know if Dr Caryl NeverBurchette would call something in.  [place] and does not want to be sick.  [facility] [address]

## 2016-03-15 NOTE — Telephone Encounter (Signed)
Given duration of symptoms,  Start Augmentin 875 mg po bid for 10 days.

## 2016-03-16 MED ORDER — AMOXICILLIN-POT CLAVULANATE 875-125 MG PO TABS: 1.0000 | ORAL_TABLET | Freq: Two times a day (BID) | ORAL | 0 refills | Status: DC

## 2016-03-16 NOTE — Telephone Encounter (Signed)
I called the pt and left a detailed message with the information below and that the Rx was sent to his pharmacy.

## 2016-04-30 DIAGNOSIS — Z3141 Encounter for fertility testing: Secondary | ICD-10-CM | POA: Diagnosis not present

## 2016-05-03 ENCOUNTER — Telehealth: Payer: Self-pay | Admitting: Family Medicine

## 2016-05-03 NOTE — Telephone Encounter (Signed)
Pt needs new rx generic adderall 20 mg °

## 2016-05-03 NOTE — Telephone Encounter (Signed)
Last refill 11-09-2015 x3 Last OV 02-15-2016 Please advise

## 2016-05-04 MED ORDER — AMPHETAMINE-DEXTROAMPHETAMINE 20 MG PO TABS: 20.0000 mg | ORAL_TABLET | Freq: Two times a day (BID) | ORAL | 0 refills | Status: DC

## 2016-05-04 NOTE — Telephone Encounter (Signed)
Refills OK. 

## 2016-05-04 NOTE — Telephone Encounter (Signed)
Pt is aware that scripts are up front for pick up. 

## 2016-07-09 DIAGNOSIS — Z113 Encounter for screening for infections with a predominantly sexual mode of transmission: Secondary | ICD-10-CM | POA: Diagnosis not present

## 2016-08-12 DIAGNOSIS — J208 Acute bronchitis due to other specified organisms: Secondary | ICD-10-CM | POA: Diagnosis not present

## 2016-08-12 DIAGNOSIS — R6889 Other general symptoms and signs: Secondary | ICD-10-CM | POA: Diagnosis not present

## 2016-08-12 DIAGNOSIS — R69 Illness, unspecified: Secondary | ICD-10-CM | POA: Diagnosis not present

## 2016-08-12 DIAGNOSIS — R05 Cough: Secondary | ICD-10-CM | POA: Diagnosis not present

## 2016-08-14 ENCOUNTER — Encounter: Payer: Self-pay | Admitting: Family Medicine

## 2016-08-14 ENCOUNTER — Ambulatory Visit (INDEPENDENT_AMBULATORY_CARE_PROVIDER_SITE_OTHER): Payer: BLUE CROSS/BLUE SHIELD | Admitting: Family Medicine

## 2016-08-14 VITALS — BP 120/88 | HR 162 | Temp 97.8°F | Ht 70.0 in | Wt 210.0 lb

## 2016-08-14 DIAGNOSIS — R69 Illness, unspecified: Secondary | ICD-10-CM

## 2016-08-14 DIAGNOSIS — J111 Influenza due to unidentified influenza virus with other respiratory manifestations: Secondary | ICD-10-CM

## 2016-08-14 MED ORDER — ALBUTEROL SULFATE HFA 108 (90 BASE) MCG/ACT IN AERS: 1.0000 | INHALATION_SPRAY | Freq: Four times a day (QID) | RESPIRATORY_TRACT | 2 refills | Status: DC | PRN

## 2016-08-14 NOTE — Progress Notes (Signed)
Subjective:     Patient ID: Jeffrey [name], male   DOB: 05/22/1981, 36 y.o.   MRN: [id number]  HPI Patient seen with influenza-like illness. Onset Sunday. He went to local urgent care and had negative influenza screen but prescribed Tamiflu because of classic symptoms. He was also prescribed Tessalon for cough and Zithromax. Had some mild nausea but no vomiting. He still has some fever and body aches and frequent sweats. No diarrhea. He initially had some mild dyspnea. No chest pain. No active wheezing. He is requesting refill of Ventolin inhaler.  Past Medical History:  Diagnosis Date  . Asthma    Past Surgical History:  Procedure Laterality Date  . ANTERIOR CRUCIATE LIGAMENT REPAIR Right 2010  . TONSILLECTOMY AND ADENOIDECTOMY     as a child    reports that he has never smoked. He has never used smokeless tobacco. He reports that he drinks alcohol. He reports that he does not use drugs. family history includes Cancer in his father; Heart disease in his father, maternal grandfather, maternal grandmother, paternal grandfather, and paternal grandmother; Hypertension in his father. Allergies  Allergen Reactions  . Phenergan [Promethazine Hcl] Nausea And Vomiting     Review of Systems  Constitutional: Positive for chills, fatigue and fever.  HENT: Positive for congestion and sore throat.   Respiratory: Positive for cough.   Cardiovascular: Negative for chest pain.  Gastrointestinal: Negative for abdominal pain.  Genitourinary: Negative for dysuria.  Skin: Negative for rash.  Neurological: Positive for headaches.       Objective:   Physical Exam  Constitutional: He appears well-developed and well-nourished.  HENT:  Right Ear: External ear normal.  Left Ear: External ear normal.  Mouth/Throat: Oropharynx is clear and moist.  Neck: Neck supple.  Cardiovascular: Normal rate and regular rhythm.   Pulmonary/Chest: Effort normal and breath sounds normal. No respiratory distress. He  has no wheezes. He has no rales.  Lymphadenopathy:    He has no cervical adenopathy.  Skin: No rash noted.       Assessment:     Influenza-like illness. Patient is nontoxic in appearance    Plan:     -He will finish out Tamiflu and Zithromax. -Stay well-hydrated -Continue Tylenol or ibuprofen as needed for body aches and headache -Follow-up for any persistent or worsening symptoms  [name] W Advay Volante MD [facility]

## 2016-08-14 NOTE — Progress Notes (Signed)
Pre visit review using our clinic review tool, if applicable. No additional management support is needed unless otherwise documented below in the visit note. 

## 2016-08-14 NOTE — Patient Instructions (Signed)

## 2016-08-17 DIAGNOSIS — H10413 Chronic giant papillary conjunctivitis, bilateral: Secondary | ICD-10-CM | POA: Diagnosis not present

## 2016-08-28 ENCOUNTER — Telehealth: Payer: Self-pay | Admitting: Family Medicine

## 2016-08-28 NOTE — Telephone Encounter (Signed)
Last refill 12/06/15.  Last office visit 08/14/16.  Okay to fill?

## 2016-08-28 NOTE — Telephone Encounter (Signed)
Pt need new Rx for lorazepam   Pharm:  [facility]

## 2016-08-29 MED ORDER — LORAZEPAM 0.5 MG PO TABS: 0.5000 mg | ORAL_TABLET | Freq: Three times a day (TID) | ORAL | 0 refills | Status: DC | PRN

## 2016-08-29 NOTE — Telephone Encounter (Signed)
Left message on machine for patient to return our call 

## 2016-08-29 NOTE — Telephone Encounter (Signed)
Refill #20.   Should only use very sparingly for severe anxiety.

## 2016-08-29 NOTE — Addendum Note (Signed)
Addended by: Kern ReapVEREEN, Aadith Raudenbush B on: 08/29/2016 11:02 AM   Modules accepted: Orders

## 2016-08-29 NOTE — Telephone Encounter (Signed)
Patient is aware and Rx called in. 

## 2016-10-18 ENCOUNTER — Telehealth: Payer: Self-pay | Admitting: Family Medicine

## 2016-10-18 NOTE — Telephone Encounter (Signed)
Pt request refill of the following: amphetamine-dextroamphetamine (ADDERALL) 20 MG tablet ° ° °Phamacy: ° °

## 2016-10-19 MED ORDER — AMPHETAMINE-DEXTROAMPHETAMINE 20 MG PO TABS: 20.0000 mg | ORAL_TABLET | Freq: Two times a day (BID) | ORAL | 0 refills | Status: DC

## 2016-10-19 NOTE — Telephone Encounter (Signed)
Last refill 05/04/16.  08/14/16 office visit for flu.  07/28/15 CPE.  Okay to refill?

## 2016-10-19 NOTE — Telephone Encounter (Signed)
Refills OK. 

## 2016-10-19 NOTE — Telephone Encounter (Signed)
Rx ready for pick up.  Left message on machine for patient. 

## 2016-12-03 ENCOUNTER — Ambulatory Visit (INDEPENDENT_AMBULATORY_CARE_PROVIDER_SITE_OTHER): Payer: BLUE CROSS/BLUE SHIELD | Admitting: Family Medicine

## 2016-12-03 ENCOUNTER — Encounter: Payer: Self-pay | Admitting: Family Medicine

## 2016-12-03 VITALS — BP 110/74 | HR 70 | Temp 98.0°F | Ht 71.75 in | Wt 218.3 lb

## 2016-12-03 DIAGNOSIS — Z0000 Encounter for general adult medical examination without abnormal findings: Secondary | ICD-10-CM

## 2016-12-03 LAB — CBC WITH DIFFERENTIAL/PLATELET
BASOS ABS: 0.1 10*3/uL (ref 0.0–0.1)
Basophils Relative: 1.3 % (ref 0.0–3.0)
EOS ABS: 0.5 10*3/uL (ref 0.0–0.7)
Eosinophils Relative: 10.3 % — ABNORMAL HIGH (ref 0.0–5.0)
HCT: 41.9 % (ref 39.0–52.0)
Hemoglobin: 14.3 g/dL (ref 13.0–17.0)
LYMPHS ABS: 1.8 10*3/uL (ref 0.7–4.0)
Lymphocytes Relative: 34.9 % (ref 12.0–46.0)
MCHC: 34.2 g/dL (ref 30.0–36.0)
MCV: 89.4 fl (ref 78.0–100.0)
MONOS PCT: 7.7 % (ref 3.0–12.0)
Monocytes Absolute: 0.4 10*3/uL (ref 0.1–1.0)
NEUTROS PCT: 45.8 % (ref 43.0–77.0)
Neutro Abs: 2.3 10*3/uL (ref 1.4–7.7)
Platelets: 226 10*3/uL (ref 150.0–400.0)
RBC: 4.68 Mil/uL (ref 4.22–5.81)
RDW: 13.8 % (ref 11.5–15.5)
WBC: 5.1 10*3/uL (ref 4.0–10.5)

## 2016-12-03 LAB — BASIC METABOLIC PANEL
BUN: 10 mg/dL (ref 6–23)
CALCIUM: 9.2 mg/dL (ref 8.4–10.5)
CHLORIDE: 104 meq/L (ref 96–112)
CO2: 32 meq/L (ref 19–32)
CREATININE: 0.64 mg/dL (ref 0.40–1.50)
GFR: 151.93 mL/min (ref >60.00)
Glucose, Bld: 80 mg/dL (ref 70–99)
Potassium: 3.9 mEq/L (ref 3.5–5.1)
Sodium: 141 mEq/L (ref 135–145)

## 2016-12-03 LAB — HEPATIC FUNCTION PANEL
ALK PHOS: 42 U/L (ref 39–117)
ALT: 9 U/L (ref 0–53)
AST: 13 U/L (ref 0–37)
Albumin: 4.2 g/dL (ref 3.5–5.2)
BILIRUBIN DIRECT: 0.1 mg/dL (ref 0.0–0.3)
BILIRUBIN TOTAL: 0.7 mg/dL (ref 0.2–1.2)
TOTAL PROTEIN: 6.3 g/dL (ref 6.0–8.3)

## 2016-12-03 LAB — LIPID PANEL
CHOL/HDL RATIO: 2
Cholesterol: 118 mg/dL (ref 0–200)
HDL: 49.1 mg/dL (ref >39.00)
LDL CALC: 54 mg/dL (ref 0–99)
NONHDL: 68.65
Triglycerides: 73 mg/dL (ref 0.0–149.0)
VLDL: 14.6 mg/dL (ref 0.0–40.0)

## 2016-12-03 LAB — TSH: TSH: 0.79 u[IU]/mL (ref 0.35–4.50)

## 2016-12-03 NOTE — Progress Notes (Signed)
Subjective:     Patient ID: Jeffrey [name], male   DOB: Aug 23, 1980, 36 y.o.   MRN: [id number]  HPI Patient seen for physical exam. He and his wife are still looking at trying to get pregnant with their first child. They're seeing a fertility specialist.  His weight has been stable. He has attention deficit disorder and infrequently takes low-dose Adderall. No other medications. He made dietary changes with giving up meat over year ago. He is not a strict vegan. He consumes eggs and dairy products and also takes a multivitamin B12 supplement.  He's had some mild varicose veins right leg but these are nonpainful. Positive family history of varicose veins. Father died of sudden death around age 60. They think this may been cardiac but not sure. His mom had coronary she is in her 64s.  Past Medical History:  Diagnosis Date  . Asthma    Past Surgical History:  Procedure Laterality Date  . ANTERIOR CRUCIATE LIGAMENT REPAIR Right 2010  . TONSILLECTOMY AND ADENOIDECTOMY     as a child    reports that he has never smoked. He has never used smokeless tobacco. He reports that he drinks alcohol. He reports that he does not use drugs. family history includes Cancer in his father; Heart disease in his father, maternal grandfather, maternal grandmother, paternal grandfather, and paternal grandmother; Hypertension in his father. Allergies  Allergen Reactions  . Phenergan [Promethazine Hcl] Nausea And Vomiting      Review of Systems  Constitutional: Negative for activity change, appetite change, fatigue and fever.  HENT: Negative for congestion, ear pain and trouble swallowing.   Eyes: Negative for pain and visual disturbance.  Respiratory: Negative for cough, shortness of breath and wheezing.   Cardiovascular: Negative for chest pain and palpitations.  Gastrointestinal: Negative for abdominal distention, abdominal pain, blood in stool, constipation, diarrhea, nausea, rectal pain and vomiting.   Genitourinary: Negative for dysuria, hematuria and testicular pain.  Musculoskeletal: Negative for arthralgias and joint swelling.  Skin: Negative for rash.  Neurological: Negative for dizziness, syncope and headaches.  Hematological: Negative for adenopathy.  Psychiatric/Behavioral: Negative for confusion and dysphoric mood.       Objective:   Physical Exam  Constitutional: He is oriented to person, place, and time. He appears well-developed and well-nourished. No distress.  HENT:  Head: Normocephalic and atraumatic.  Right Ear: External ear normal.  Left Ear: External ear normal.  Mouth/Throat: Oropharynx is clear and moist.  Eyes: Conjunctivae and EOM are normal. Pupils are equal, round, and reactive to light.  Neck: Normal range of motion. Neck supple. No thyromegaly present.  Cardiovascular: Normal rate, regular rhythm and normal heart sounds.   No murmur heard. Pulmonary/Chest: No respiratory distress. He has no wheezes. He has no rales.  Abdominal: Soft. Bowel sounds are normal. He exhibits no distension and no mass. There is no tenderness. There is no rebound and no guarding.  Musculoskeletal: He exhibits no edema.  Lymphadenopathy:    He has no cervical adenopathy.  Neurological: He is alert and oriented to person, place, and time. He displays normal reflexes. No cranial nerve deficit.  Skin: No rash noted.  He has some nontender varicose veins right calf  Psychiatric: He has a normal mood and affect. His behavior is normal. Judgment and thought content normal.       Assessment:     Physical exam. Generally healthy 36 year old male    Plan:     -Tetanus up-to-date -Discussed screening labs and patient wishes  to proceed -We recommend he try to lose further weight and especially body fat around the waist area. -Regular compression stockings for varicose veins lower extremities  [name] W Edward Guthmiller MD [place] [facility]

## 2016-12-04 ENCOUNTER — Telehealth: Payer: Self-pay | Admitting: Family Medicine

## 2016-12-04 NOTE — Telephone Encounter (Signed)
Patient is aware of lab results.

## 2016-12-04 NOTE — Telephone Encounter (Signed)
Pt returning your call

## 2016-12-25 ENCOUNTER — Encounter: Payer: Self-pay | Admitting: Family Medicine

## 2016-12-25 ENCOUNTER — Ambulatory Visit (INDEPENDENT_AMBULATORY_CARE_PROVIDER_SITE_OTHER): Payer: BLUE CROSS/BLUE SHIELD | Admitting: Family Medicine

## 2016-12-25 VITALS — BP 120/80 | HR 86 | Temp 98.5°F | Wt 213.6 lb

## 2016-12-25 DIAGNOSIS — R0789 Other chest pain: Secondary | ICD-10-CM

## 2016-12-25 NOTE — Progress Notes (Signed)
Subjective:     Patient ID: Jeffrey [name], male   DOB: Dec 24, 1980, 36 y.o.   MRN: [id number]  HPI Patient is seen with episode of chest pressure yesterday when running on treadmill. He states that Sunday night he felt somewhat nauseous but no vomiting. Denied any chest pain at that point. On Monday around lunchtime he got on treadmill which is typical for him. About 1/2 mile into his run he developed some substernal chest pressure. No radiation. He felt more fatigued than usual and also some increased dyspnea more than usual. Symptoms resolved after about 5 minutes.  He's had a couple episodes recently with exercise where he felt slightly more dyspnea than usual. He denies any GERD symptoms. No dysphagia. Nonsmoker. Father had possible cardiac event age 34 with sudden death. Mother had cardiac cath age 34 with minimal CAD. Maternal great uncle died age 34 of CAD.  Patient has no history of hyperlipidemia or significant dyslipidemia. He has attention deficit disorder and is on stimulant medication  Past Medical History:  Diagnosis Date  . Asthma    Past Surgical History:  Procedure Laterality Date  . ANTERIOR CRUCIATE LIGAMENT REPAIR Right 2010  . TONSILLECTOMY AND ADENOIDECTOMY     as a child    reports that he has never smoked. He has never used smokeless tobacco. He reports that he drinks alcohol. He reports that he does not use drugs. family history includes Cancer in his father; Heart disease in his father, maternal grandfather, maternal grandmother, paternal grandfather, and paternal grandmother; Hypertension in his father. Allergies  Allergen Reactions  . Phenergan [Promethazine Hcl] Nausea And Vomiting     Review of Systems  Constitutional: Negative for fatigue and unexpected weight change.  Eyes: Negative for visual disturbance.  Respiratory: Positive for chest tightness. Negative for cough and wheezing.   Cardiovascular: Negative for palpitations and leg swelling. Chest pain:  See history of present illness.  Gastrointestinal: Negative for abdominal pain.  Genitourinary: Negative for dysuria.  Neurological: Negative for dizziness, syncope, weakness, light-headedness and headaches.       Objective:   Physical Exam  Constitutional: He appears well-developed and well-nourished.  Neck: Neck supple. No thyromegaly present.  Cardiovascular: Normal rate and regular rhythm.  Exam reveals no gallop.   Pulmonary/Chest: Effort normal and breath sounds normal. No respiratory distress. He has no wheezes. He has no rales.  Musculoskeletal: He exhibits no edema.       Assessment:     Patient presents with episode yesterday during exercise of some substernal chest pressure which lasted about 5 minutes. No episodes since then. Does have positive family history of CAD as above. EKG shows normal sinus rhythm with no acute changes.  His pretest probability would be fairly low given his age    Plan:     -Leave off stimulant medication for now -Consider baby aspirin 81 mg daily until further evaluated -Avoid strenuous activity until further evaluated -Set up exercise tolerance test to further evaluate -Follow-up immediately for any recurrent symptoms or other concerns  [name] W Burchette MD [facility]

## 2016-12-25 NOTE — Patient Instructions (Signed)
Leave off the Adderal for now Avoid strenuous exercise until further evaluation. We will set up stress test to further evaluate.

## 2016-12-27 ENCOUNTER — Telehealth: Payer: Self-pay

## 2016-12-27 NOTE — Telephone Encounter (Signed)
[facility] Client [facility] Night - Client Client Site [facility] - Night Physician Evelena PeatBurchette, Bruce - MD Contact Type Call Who Is Calling Patient / Member / Family / Caregiver Caller Name Tamera StandsJustin Sorbo Caller Phone Number [phone] Call Type Message Only Information Provided Reason for Call Returning a Call from the Office Initial Comment Caller stated was seen yesterday for chest pain and is returning the call in regards to update. Wanted to know if could see if possible aneurism or blood clot in lungs.   Dr. Caryl NeverBurchette - Please advise. Thanks!

## 2016-12-28 NOTE — Telephone Encounter (Signed)
We set up exercise stress test since he had exertional chest symptoms.  We did not do any testing to rule out PE.  IF he is developing any persistent dyspnea or other new symptom such as one sided calf pain or leg edema needs immediate follow up.  Aneurysm would be very unlikely at his age.

## 2016-12-28 NOTE — Telephone Encounter (Signed)
Spoke with pt and advised. He denies any new symptoms. He will seek UC/ED if any new symptoms. Nothing further needed at this time.

## 2016-12-28 NOTE — Telephone Encounter (Signed)
LMTCB

## 2017-01-02 ENCOUNTER — Telehealth: Payer: Self-pay | Admitting: Family Medicine

## 2017-01-02 NOTE — Telephone Encounter (Signed)
Per 12/25/16 office visit you indicated you wanted stress test ordered. Would you still like this ordered? Or referral for visit with cardiologist?

## 2017-01-02 NOTE — Telephone Encounter (Signed)
Pt called stated that he has not heard anything from the cardiologist and wanted to know if someone could call to check the status of the appointment.

## 2017-01-02 NOTE — Telephone Encounter (Signed)
If you look under "CV Procedures" stress test was ordered.  Can we check with cardiology to see if they are waiting for insurance approval or other issue?  Let pt know we placed order day he was here.

## 2017-01-03 NOTE — Telephone Encounter (Signed)
Discussed referral with referral coordinator who reports that she will work on referral today. Left message with patient to update on status.

## 2017-01-04 ENCOUNTER — Telehealth: Payer: Self-pay | Admitting: Family Medicine

## 2017-01-04 NOTE — Telephone Encounter (Signed)
Left message for return call.

## 2017-01-04 NOTE — Telephone Encounter (Signed)
Fleet ContrasRachel, can you please follow up on this?

## 2017-01-04 NOTE — Telephone Encounter (Signed)
error 

## 2017-01-07 NOTE — Telephone Encounter (Signed)
Left message on machine for patient to return our call.  Appointment scheduled for August 2 3:30.

## 2017-01-09 NOTE — Telephone Encounter (Signed)
Left message on machine for patient to return our call 

## 2017-01-15 ENCOUNTER — Telehealth (HOSPITAL_COMMUNITY): Payer: Self-pay

## 2017-01-15 NOTE — Telephone Encounter (Signed)
Left message on machine for patient to return our call 

## 2017-01-15 NOTE — Telephone Encounter (Signed)
Close encounter 

## 2017-01-16 ENCOUNTER — Telehealth (HOSPITAL_COMMUNITY): Payer: Self-pay

## 2017-01-16 NOTE — Telephone Encounter (Signed)
Encounter complete. 

## 2017-01-17 ENCOUNTER — Ambulatory Visit (HOSPITAL_COMMUNITY)
Admission: RE | Admit: 2017-01-17 | Discharge: 2017-01-17 | Disposition: A | Discharge status: Home / Self Care | Payer: BLUE CROSS/BLUE SHIELD | Source: Ambulatory Visit | Attending: Cardiology | Admitting: Cardiology

## 2017-01-17 DIAGNOSIS — R0789 Other chest pain: Secondary | ICD-10-CM

## 2017-01-17 LAB — EXERCISE TOLERANCE TEST
CHL RATE OF PERCEIVED EXERTION: 16
CSEPED: 13 min
CSEPEW: 16.8 METS
CSEPHR: 93 %
Exercise duration (sec): 42 s
MPHR: 186 {beats}/min
Peak HR: 173 {beats}/min
Rest HR: 70 {beats}/min

## 2017-03-04 DIAGNOSIS — Z3181 Encounter for male factor infertility in female patient: Secondary | ICD-10-CM | POA: Diagnosis not present

## 2017-03-15 ENCOUNTER — Ambulatory Visit (INDEPENDENT_AMBULATORY_CARE_PROVIDER_SITE_OTHER): Payer: BLUE CROSS/BLUE SHIELD | Admitting: Family Medicine

## 2017-03-15 ENCOUNTER — Encounter: Payer: Self-pay | Admitting: Family Medicine

## 2017-03-15 VITALS — BP 120/80 | HR 87 | Temp 97.9°F | Wt 218.4 lb

## 2017-03-15 DIAGNOSIS — L301 Dyshidrosis [pompholyx]: Secondary | ICD-10-CM

## 2017-03-15 DIAGNOSIS — Z23 Encounter for immunization: Secondary | ICD-10-CM

## 2017-03-15 MED ORDER — DESOXIMETASONE 0.25 % EX OINT: 1.0000 "application " | TOPICAL_OINTMENT | Freq: Two times a day (BID) | CUTANEOUS | 2 refills | Status: DC | PRN

## 2017-03-15 MED ORDER — AMPHETAMINE-DEXTROAMPHETAMINE 20 MG PO TABS: 20.0000 mg | ORAL_TABLET | Freq: Two times a day (BID) | ORAL | 0 refills | Status: DC

## 2017-03-15 MED ORDER — AMPHETAMINE-DEXTROAMPHETAMINE 20 MG PO TABS
20.0000 mg | ORAL_TABLET | Freq: Two times a day (BID) | ORAL | 0 refills | Status: DC
Start: 2017-03-15 — End: 2017-08-26

## 2017-03-15 NOTE — Progress Notes (Signed)
Subjective:     Patient ID: Jeffrey [name], male   DOB: 20-Mar-1981, 36 y.o.   MRN: [id number]  HPI Patient seen with skin rash right foot. He has somewhat circumferential scaly area that started off with small vesicles and is fairly intensely pruritic. He's actually had similar occurrence in the past and his mom had some Topicort 0.25% ointment which seemed to help. Prior to that, he tried topical antifungals without any improvement. Denies any interdigital involvement. No left foot involvement.  No aggravating factors.  Past Medical History:  Diagnosis Date  . Asthma    Past Surgical History:  Procedure Laterality Date  . ANTERIOR CRUCIATE LIGAMENT REPAIR Right 2010  . TONSILLECTOMY AND ADENOIDECTOMY     as a child    reports that he has never smoked. He has never used smokeless tobacco. He reports that he drinks alcohol. He reports that he does not use drugs. family history includes Cancer in his father; Heart disease in his father, maternal grandfather, maternal grandmother, paternal grandfather, and paternal grandmother; Hypertension in his father. Allergies  Allergen Reactions  . Phenergan [Promethazine Hcl] Nausea And Vomiting     Review of Systems  Constitutional: Negative for chills and fever.  Skin: Positive for rash.       Objective:   Physical Exam  Constitutional: He appears well-developed and well-nourished.  Skin: Rash noted.  Patient has somewhat circumferential area of rash right foot along the medial and another similar area lateral midfoot. No raised border. No pustules. Nontender.       Assessment:     Skin rash which has been recurrent right foot. Suspect dyshidrosis. This comes up the small vesicles which eventually rupture and become scaly and have responded to high-dose steroids the past    Plan:     -Topicort 0.25% ointment once or twice daily as needed but no more than 2 weeks continuously -Touch base if rash not clearing with the above within a  couple weeks -Flu vaccine given  Kristian Covey MD [facility]

## 2017-03-25 ENCOUNTER — Telehealth: Payer: Self-pay | Admitting: Family Medicine

## 2017-03-25 NOTE — Telephone Encounter (Signed)
Received PA request for Desoximetasone. PA submitted & is pending. Key: [id number]

## 2017-03-27 NOTE — Telephone Encounter (Signed)
[employer] needs what OTC meds pt has tried and failed. Anything he has tried and failed for this condition.  They states they sent fax yesterday at 11:40 am  [phone]  Key:  [id number] [facility] w/ [employer]

## 2017-03-28 NOTE — Telephone Encounter (Signed)
Please call patient to see what OTC anti-fungals he tried and failed.

## 2017-03-28 NOTE — Telephone Encounter (Signed)
[employer] called again to ask for the types of over the counter anti-fungal medication the patient used. They want to see the list before they will approve the medication.

## 2017-03-28 NOTE — Telephone Encounter (Signed)
I left a detailed message with the information below at the pts cell number. 

## 2017-04-01 ENCOUNTER — Telehealth: Payer: Self-pay | Admitting: Family Medicine

## 2017-04-01 NOTE — Telephone Encounter (Signed)
Pt would like to see if we received the approval for desoximetasone (TOPICORT)

## 2017-04-01 NOTE — Telephone Encounter (Signed)
See other phone note

## 2017-04-01 NOTE — Telephone Encounter (Signed)
Information has been faxed back to insurance, decision has not be received yet.

## 2017-04-02 ENCOUNTER — Telehealth: Payer: Self-pay | Admitting: *Deleted

## 2017-04-02 NOTE — Telephone Encounter (Signed)
Pa denied. Patient has two try two alternative medications on the formulary. Options are: Betamethasone dipropionate 0.05% cream/lotion/ointment Desoximetasone 0.25% cream Fluocinolone 0.025% cream/ointment

## 2017-04-02 NOTE — Telephone Encounter (Signed)
Unable to attach to previous phone notes, patient returned Rachel's call.

## 2017-04-02 NOTE — Telephone Encounter (Signed)
Let's send in the Betamethasone ointment.  15 grams with one refill

## 2017-04-02 NOTE — Telephone Encounter (Signed)
Left message on machine for patient to return our call 

## 2017-04-03 MED ORDER — BETAMETHASONE DIPROPIONATE AUG 0.05 % EX OINT: TOPICAL_OINTMENT | Freq: Two times a day (BID) | CUTANEOUS | 0 refills | Status: DC

## 2017-04-03 NOTE — Telephone Encounter (Addendum)
Pt states he is good with whichever Rx Dr Caryl NeverBurchette recommends.  Pt understands he needs to have tried and failed 2 of the alternatives.

## 2017-04-03 NOTE — Telephone Encounter (Signed)
Left message on machine for patient to return our call 

## 2017-04-03 NOTE — Addendum Note (Signed)
Addended by: Kern ReapVEREEN, Shaylyn Bawa B on: 04/03/2017 01:29 PM   Modules accepted: Orders

## 2017-04-03 NOTE — Telephone Encounter (Signed)
Rx sent 

## 2017-04-04 ENCOUNTER — Telehealth: Payer: Self-pay | Admitting: Family Medicine

## 2017-04-04 NOTE — Telephone Encounter (Signed)
Pt is calling stating that he has head and chest congestion and would like to see if Dr. Caryl NeverBurchette would call something in for him.   Pharm:  [facility], [facility] (consulting)[facility]

## 2017-04-05 ENCOUNTER — Encounter: Payer: Self-pay | Admitting: *Deleted

## 2017-04-05 NOTE — Telephone Encounter (Signed)
Patient is aware and will call back as needed. 

## 2017-04-05 NOTE — Telephone Encounter (Signed)
Most head and chest congestion is viral. Stay well-hydrated and consider over-the-counter plain Mucinex. If having any fever or worsening symptoms needs to be evaluated.

## 2017-04-05 NOTE — Telephone Encounter (Signed)
Left message on machine for patient to return our call 

## 2017-04-12 DIAGNOSIS — F4321 Adjustment disorder with depressed mood: Secondary | ICD-10-CM | POA: Diagnosis not present

## 2017-05-24 DIAGNOSIS — F4321 Adjustment disorder with depressed mood: Secondary | ICD-10-CM | POA: Diagnosis not present

## 2017-06-28 DIAGNOSIS — F4321 Adjustment disorder with depressed mood: Secondary | ICD-10-CM | POA: Diagnosis not present

## 2017-07-12 ENCOUNTER — Encounter: Payer: Self-pay | Admitting: Family Medicine

## 2017-07-12 ENCOUNTER — Ambulatory Visit (INDEPENDENT_AMBULATORY_CARE_PROVIDER_SITE_OTHER): Payer: BLUE CROSS/BLUE SHIELD | Admitting: Family Medicine

## 2017-07-12 VITALS — BP 120/80 | HR 94 | Temp 98.0°F | Ht 69.0 in | Wt 217.0 lb

## 2017-07-12 DIAGNOSIS — Z0000 Encounter for general adult medical examination without abnormal findings: Secondary | ICD-10-CM | POA: Diagnosis not present

## 2017-07-12 MED ORDER — DESOXIMETASONE 0.25 % EX OINT: 1.0000 "application " | TOPICAL_OINTMENT | Freq: Two times a day (BID) | CUTANEOUS | 2 refills | Status: DC | PRN

## 2017-07-12 NOTE — Progress Notes (Signed)
Subjective:     Patient ID: Jeffrey [name], male   DOB: 1981/04/08, 37 y.o.   MRN: [id number]  HPI Patient here for physical exam. He and his wife have been involved with some in vitro fertilization and she is currently [redacted] weeks pregnant and things are going well there. He is obviously excited about that. Has history of ADD and takes Adderall for that. He lost significant amount of weight last year and has been able to maintain this thus far. Nonsmoker. Immunizations up-to-date.  Mother was diagnosed with lupus during the past year.  Past Medical History:  Diagnosis Date  . Asthma    Past Surgical History:  Procedure Laterality Date  . ANTERIOR CRUCIATE LIGAMENT REPAIR Right 2010  . TONSILLECTOMY AND ADENOIDECTOMY     as a child    reports that  has never smoked. he has never used smokeless tobacco. He reports that he drinks alcohol. He reports that he does not use drugs. family history includes Cancer in his father; Heart disease in his father, maternal grandfather, maternal grandmother, paternal grandfather, and paternal grandmother; Hypertension in his father. Allergies  Allergen Reactions  . Phenergan [Promethazine Hcl] Nausea And Vomiting     Review of Systems  Constitutional: Negative for fatigue.  Eyes: Negative for visual disturbance.  Respiratory: Negative for cough, chest tightness and shortness of breath.   Cardiovascular: Negative for chest pain, palpitations and leg swelling.  Skin: Negative for rash.  Neurological: Negative for dizziness, syncope, weakness, light-headedness and headaches.       Objective:   Physical Exam  Constitutional: He is oriented to person, place, and time. He appears well-developed and well-nourished.  HENT:  Right Ear: External ear normal.  Left Ear: External ear normal.  Mouth/Throat: Oropharynx is clear and moist.  Eyes: Pupils are equal, round, and reactive to light.  Neck: Neck supple. No thyromegaly present.  Cardiovascular:  Normal rate and regular rhythm.  Pulmonary/Chest: Effort normal and breath sounds normal. No respiratory distress. He has no wheezes. He has no rales.  Musculoskeletal: He exhibits no edema.  Neurological: He is alert and oriented to person, place, and time.       Assessment:     Physical exam. The following issues were discussed    Plan:     -Patient had labs last summer and these were reviewed and stable. We did not obtain any further labs today -Immunizations up-to-date -Continue weight maintenance efforts  [name] W Mckenzi Buonomo MD [facility]

## 2017-07-26 DIAGNOSIS — F4321 Adjustment disorder with depressed mood: Secondary | ICD-10-CM | POA: Diagnosis not present

## 2017-08-23 ENCOUNTER — Telehealth: Payer: Self-pay | Admitting: Family Medicine

## 2017-08-23 DIAGNOSIS — F4321 Adjustment disorder with depressed mood: Secondary | ICD-10-CM | POA: Diagnosis not present

## 2017-08-23 NOTE — Telephone Encounter (Signed)
Last OV: 07/12/17 PCP: Burchette [id number] [place] - [address] [phone] (Phone) [phone] (Fax)

## 2017-08-23 NOTE — Telephone Encounter (Signed)
Copied from CRM [phone]. Topic: Quick Communication - See Telephone Encounter >> Aug 23, 2017  2:53 PM Waymon AmatoBurton, Donna F wrote: CRM for notification. See Telephone encounter for:  Pt is needing a refill on ativan and adderall [facility] [address]   Best number [phone]  08/23/17.

## 2017-08-23 NOTE — Telephone Encounter (Signed)
adderall OK to refill for 3 months.  Refill Ativan once.

## 2017-08-26 MED ORDER — LORAZEPAM 0.5 MG PO TABS: 0.5000 mg | ORAL_TABLET | Freq: Three times a day (TID) | ORAL | 0 refills | Status: DC | PRN

## 2017-08-26 MED ORDER — AMPHETAMINE-DEXTROAMPHETAMINE 20 MG PO TABS: 20.0000 mg | ORAL_TABLET | Freq: Two times a day (BID) | ORAL | 0 refills | Status: DC

## 2017-08-26 NOTE — Telephone Encounter (Signed)
Left message on machine Rx ready for pick up 

## 2017-09-06 ENCOUNTER — Encounter: Payer: Self-pay | Admitting: Family Medicine

## 2017-09-06 ENCOUNTER — Ambulatory Visit: Payer: BLUE CROSS/BLUE SHIELD | Admitting: Family Medicine

## 2017-09-06 VITALS — BP 110/80 | HR 80 | Temp 98.3°F | Wt 211.3 lb

## 2017-09-06 DIAGNOSIS — B349 Viral infection, unspecified: Secondary | ICD-10-CM

## 2017-09-06 MED ORDER — OSELTAMIVIR PHOSPHATE 75 MG PO CAPS: 75.0000 mg | ORAL_CAPSULE | Freq: Every day | ORAL | 0 refills | Status: DC

## 2017-09-06 NOTE — Progress Notes (Signed)
Subjective:     Patient ID: Jeffrey [name], male   DOB: October 07, 1980, 37 y.o.   MRN: [id number]  HPI   Patient is seen with concerns for influenza. His mother was diagnosed last week and he helped to take care of her. He developed some congestion yesterday along with cough with no definite fever. Only mild body aches. He is especially concerned because his wife is pregnant and he is interested in ruling out flu and also possible prophylaxis if he is negative. He denies any nausea or vomiting  Past Medical History:  Diagnosis Date  . Asthma    Past Surgical History:  Procedure Laterality Date  . ANTERIOR CRUCIATE LIGAMENT REPAIR Right 2010  . TONSILLECTOMY AND ADENOIDECTOMY     as a child    reports that he has never smoked. He has never used smokeless tobacco. He reports that he drinks alcohol. He reports that he does not use drugs. family history includes Cancer in his father; Heart disease in his father, maternal grandfather, maternal grandmother, paternal grandfather, and paternal grandmother; Hypertension in his father. Allergies  Allergen Reactions  . Phenergan [Promethazine Hcl] Nausea And Vomiting     Review of Systems  Constitutional: Positive for fatigue. Negative for chills and fever.  HENT: Positive for congestion.   Respiratory: Positive for cough.   Gastrointestinal: Negative for nausea and vomiting.       Objective:   Physical Exam  Constitutional: He appears well-developed and well-nourished.  HENT:  Right Ear: External ear normal.  Left Ear: External ear normal.  Mouth/Throat: Oropharynx is clear and moist.  Neck: Neck supple.  Cardiovascular: Normal rate and regular rhythm.  Pulmonary/Chest: Effort normal and breath sounds normal. No respiratory distress. He has no wheezes. He has no rales.  Lymphadenopathy:    He has no cervical adenopathy.       Assessment:     Probable viral syndrome. Recent exposure to influenza.  Doubt acute influenza based on  symptoms- but pt requesting testing.    Plan:     -Check influenza screen=negative. -Consider prophylaxis with Tamiflu 75 mg once daily for 10 days  Jeffrey [name] Jeffrey [name] Jeffrey [name]

## 2017-09-26 ENCOUNTER — Ambulatory Visit: Payer: BLUE CROSS/BLUE SHIELD | Admitting: Family Medicine

## 2017-09-26 ENCOUNTER — Encounter: Payer: Self-pay | Admitting: Family Medicine

## 2017-09-26 VITALS — BP 122/62 | HR 120 | Temp 98.4°F | Wt 213.0 lb

## 2017-09-26 DIAGNOSIS — H66002 Acute suppurative otitis media without spontaneous rupture of ear drum, left ear: Secondary | ICD-10-CM | POA: Diagnosis not present

## 2017-09-26 DIAGNOSIS — J069 Acute upper respiratory infection, unspecified: Secondary | ICD-10-CM

## 2017-09-26 DIAGNOSIS — R509 Fever, unspecified: Secondary | ICD-10-CM | POA: Diagnosis not present

## 2017-09-26 LAB — POC INFLUENZA A&B (BINAX/QUICKVUE)
Influenza A, POC: NEGATIVE
Influenza B, POC: NEGATIVE

## 2017-09-26 MED ORDER — AMOXICILLIN 500 MG PO CAPS: 500.0000 mg | ORAL_CAPSULE | Freq: Two times a day (BID) | ORAL | 0 refills | Status: AC

## 2017-09-26 NOTE — Patient Instructions (Signed)
Otitis Media, Adult Otitis media occurs when there is inflammation and fluid in the middle ear. Your middle ear is a part of the ear that contains bones for hearing as well as air that helps send sounds to your brain. What are the causes? This condition is caused by a blockage in the eustachian tube. This tube drains fluid from the ear to the back of the nose (nasopharynx). A blockage in this tube can be caused by an object or by swelling (edema) in the tube. Problems that can cause a blockage include:  A cold or other upper respiratory infection.  Allergies.  An irritant, such as tobacco smoke.  Enlarged adenoids. The adenoids are areas of soft tissue located high in the back of the throat, behind the nose and the roof of the mouth.  A mass in the nasopharynx.  Damage to the ear caused by pressure changes (barotrauma). What are the signs or symptoms? Symptoms of this condition include:  Ear pain.  A fever.  Decreased hearing.  A headache.  Tiredness (lethargy).  Fluid leaking from the ear.  Ringing in the ear. How is this diagnosed? This condition is diagnosed with a physical exam. During the exam your health care provider will use an instrument called an otoscope to look into your ear and check for redness, swelling, and fluid. He or she will also ask about your symptoms. Your health care provider may also order tests, such as:  A test to check the movement of the eardrum (pneumatic otoscopy). This test is done by squeezing a small amount of air into the ear.  A test that changes air pressure in the middle ear to check how well the eardrum moves and whether the eustachian tube is working (tympanogram). How is this treated? This condition usually goes away on its own within 3-5 days. But if the condition is caused by a bacteria infection and does not go away own its own, or keeps coming back, your health care provider may:  Prescribe antibiotic medicines to treat the  infection.  Prescribe or recommend medicines to control pain. Follow these instructions at home:  Take over-the-counter and prescription medicines only as told by your health care provider.  If you were prescribed an antibiotic medicine, take it as told by your health care provider. Do not stop taking the antibiotic even if you start to feel better.  Keep all follow-up visits as told by your health care provider. This is important. Contact a health care provider if:  You have bleeding from your nose.  There is a lump on your neck.  You are not getting better in 5 days.  You feel worse instead of better. Get help right away if:  You have severe pain that is not controlled with medicine.  You have swelling, redness, or pain around your ear.  You have stiffness in your neck.  A part of your face is paralyzed.  The bone behind your ear (mastoid) is tender when you touch it.  You develop a severe headache. Summary  Otitis media is redness, soreness, and swelling of the middle ear.  This condition usually goes away on its own within 3-5 days.  If the problem does not go away in 3-5 days, your health care provider may prescribe or recommend medicines to treat your symptoms.  If you were prescribed an antibiotic medicine, take it as told by your health care provider. This information is not intended to replace advice given to you by your   to you by your health care provider. Make sure you discuss any questions you have with your health care provider. Document Released: 03/09/2004 Document Revised: 05/25/2016 Document Reviewed: 05/25/2016 Elsevier Interactive Patient Education  2018 [employer]  Viral Respiratory Infection A respiratory infection is an illness that affects part of the respiratory system, such as the lungs, nose, or throat. Most respiratory infections are caused by either viruses or bacteria. A respiratory infection that is caused by a virus is called a viral respiratory  infection. Common types of viral respiratory infections include:  A cold.  The flu (influenza).  A respiratory syncytial virus (RSV) infection.  How do I know if I have a viral respiratory infection? Most viral respiratory infections cause:  A stuffy or runny nose.  Yellow or green nasal discharge.  A cough.  Sneezing.  Fatigue.  Achy muscles.  A sore throat.  Sweating or chills.  A fever.  A headache.  How are viral respiratory infections treated? If influenza is diagnosed early, it may be treated with an antiviral medicine that shortens the length of time a person has symptoms. Symptoms of viral respiratory infections may be treated with over-the-counter and prescription medicines, such as:  Expectorants. These make it easier to cough up mucus.  Decongestant nasal sprays.  Health care providers do not prescribe antibiotic medicines for viral infections. This is because antibiotics are designed to kill bacteria. They have no effect on viruses. How do I know if I should stay home from work or school? To avoid exposing others to your respiratory infection, stay home if you have:  A fever.  A persistent cough.  A sore throat.  A runny nose.  Sneezing.  Muscles aches.  Headaches.  Fatigue.  Weakness.  Chills.  Sweating.  Nausea.  Follow these instructions at home:  Rest as much as possible.  Take over-the-counter and prescription medicines only as told by your health care provider.  Drink enough fluid to keep your urine clear or pale yellow. This helps prevent dehydration and helps loosen up mucus.  Gargle with a salt-water mixture 3-4 times per day or as needed. To make a salt-water mixture, completely dissolve -1 tsp of salt in 1 cup of warm water.  Use nose drops made from salt water to ease congestion and soften raw skin around your nose.  Do not drink alcohol.  Do not use tobacco products, including cigarettes, chewing tobacco, and  e-cigarettes. If you need help quitting, ask your health care provider. Contact a health care provider if:  Your symptoms last for 10 days or longer.  Your symptoms get worse over time.  You have a fever.  You have severe sinus pain in your face or forehead.  The glands in your jaw or neck become very swollen. Get help right away if:  You feel pain or pressure in your chest.  You have shortness of breath.  You faint or feel like you will faint.  You have severe and persistent vomiting.  You feel confused or disoriented. This information is not intended to replace advice given to you by your health care provider. Make sure you discuss any questions you have with your health care provider. Document Released: 03/14/2005 Document Revised: 11/10/2015 Document Reviewed: 11/10/2014 Elsevier Interactive Patient Education  [employer]2018 Elsevier Inc.

## 2017-09-26 NOTE — Progress Notes (Signed)
Subjective:    Patient ID: Jeffrey [name], male    DTamera StandsOB: 23-Sep-1980, 37 y.o.   MRN: [id number]  Chief Complaint  Patient presents with  . Nasal Congestion  . Fever  . Sore Throat    HPI Patient was seen today for acute concern.  Patient endorses chills, sore throat, fever T-max 102.7, chest congestion, loose stools which started overnight.  Patient is taking vitamin C and elderberry for his symptoms.  Patient endorses going to a birthday party for his nieces over the weekend 1 of which had flu the other strep throat and pneumonia.  Patient was concerned about being sick as his wife is at high risk pregnancy.  Past Medical History:  Diagnosis Date  . Asthma     Allergies  Allergen Reactions  . Phenergan [Promethazine Hcl] Nausea And Vomiting    ROS General: Denies night sweats, changes in weight, changes in appetite  +fever, chills HEENT: Denies headaches, ear pain, changes in vision, rhinorrhea   +sore throat, nasal congestion CV: Denies CP, palpitations, SOB, orthopnea Pulm: Denies SOB, cough, wheezing GI: Denies abdominal pain, nausea, vomiting, diarrhea, constipation   +loose stools GU: Denies dysuria, hematuria, frequency, vaginal discharge Msk: Denies muscle cramps, joint pains Neuro: Denies weakness, numbness, tingling Skin: Denies rashes, bruising Psych: Denies depression, anxiety, hallucinations     Objective:    Blood pressure 122/62, pulse (!) 120, temperature 98.4 F (36.9 C), temperature source Oral, weight 213 lb (96.6 kg), SpO2 96 %.   Gen. Pleasant, well-nourished, in no distress, normal affect, diaphoretic, nontoxic HEENT: [place]/AT, face symmetric, no scleral icterus, PERRLA, nares patent without drainage, pharynx with erythema, no exudate.  Left TM with erythema and purulent fluid, full.  R TM full.  No cervical lymphadenopathy. Lungs: no accessory muscle use, CTAB, no wheezes or rales Cardiovascular: RRR, no m/r/g, no peripheral edema Abdomen: BS present,  soft, NT/ND Neuro:  A&Ox3, CN II-XII intact, normal gait   Wt Readings from Last 3 Encounters:  09/26/17 213 lb (96.6 kg)  09/06/17 211 lb 4.8 oz (95.8 kg)  07/12/17 217 lb (98.4 kg)    Lab Results  Component Value Date   WBC 5.1 12/03/2016   HGB 14.3 12/03/2016   HCT 41.9 12/03/2016   PLT 226.0 12/03/2016   GLUCOSE 80 12/03/2016   CHOL 118 12/03/2016   TRIG 73.0 12/03/2016   HDL 49.10 12/03/2016   LDLCALC 54 12/03/2016   ALT 9 12/03/2016   AST 13 12/03/2016   NA 141 12/03/2016   K 3.9 12/03/2016   CL 104 12/03/2016   CREATININE 0.64 12/03/2016   BUN 10 12/03/2016   CO2 32 12/03/2016   TSH 0.79 12/03/2016    Assessment/Plan:  Non-recurrent acute suppurative otitis media of left ear without spontaneous rupture of tympanic membrane  - Plan: amoxicillin (AMOXIL) 500 MG capsule  Viral URI -Supportive care -Patient encouraged to stay hydrated -Patient to take Tylenol or ibuprofen as needed for any fever, pain/discomfort  Fever, unspecified fever cause  - Plan: POC Influenza A&B(BINAX/QUICKVUE) negative  Follow-up PRN  Abbe AmsterdamShannon Atwell Mcdanel, MD

## 2017-10-25 DIAGNOSIS — F4321 Adjustment disorder with depressed mood: Secondary | ICD-10-CM | POA: Diagnosis not present

## 2017-11-15 DIAGNOSIS — F4321 Adjustment disorder with depressed mood: Secondary | ICD-10-CM | POA: Diagnosis not present

## 2017-12-30 ENCOUNTER — Ambulatory Visit: Payer: BLUE CROSS/BLUE SHIELD | Admitting: Family Medicine

## 2017-12-30 ENCOUNTER — Encounter: Payer: Self-pay | Admitting: Family Medicine

## 2017-12-30 VITALS — BP 120/80 | HR 88 | Temp 98.0°F | Wt 217.5 lb

## 2017-12-30 DIAGNOSIS — M5441 Lumbago with sciatica, right side: Secondary | ICD-10-CM

## 2017-12-30 DIAGNOSIS — F988 Other specified behavioral and emotional disorders with onset usually occurring in childhood and adolescence: Secondary | ICD-10-CM | POA: Diagnosis not present

## 2017-12-30 MED ORDER — CYCLOBENZAPRINE HCL 10 MG PO TABS: 10.0000 mg | ORAL_TABLET | Freq: Three times a day (TID) | ORAL | 0 refills | Status: DC | PRN

## 2017-12-30 MED ORDER — AMPHETAMINE-DEXTROAMPHETAMINE 20 MG PO TABS: 20.0000 mg | ORAL_TABLET | Freq: Two times a day (BID) | ORAL | 0 refills | Status: DC

## 2017-12-30 MED ORDER — BETAMETHASONE DIPROPIONATE AUG 0.05 % EX OINT: TOPICAL_OINTMENT | Freq: Two times a day (BID) | CUTANEOUS | 1 refills | Status: AC

## 2017-12-30 MED ORDER — PREDNISONE 10 MG PO TABS: ORAL_TABLET | ORAL | 0 refills | Status: DC

## 2017-12-30 NOTE — Progress Notes (Signed)
  Subjective:     Patient ID: Jeffrey [name], male   DOB: 07-03-1980, 37 y.o.   MRN: [id number]  HPI Patient seen for the following issues  History of attention deficit disorder. He is on Adderall 20 mg twice daily. No history of side effects. Requesting refills. No headaches or insomnia issues  Acute issue of onset Thursday of lower back pain. Location is right lower lumbar region with some radiation down posterior lateral to about the knee. No weakness. No numbness. No urinary incontinence. He's tried Aleve, heat, ice without relief. Pain is severe at times. Worse with changing positions. No prior history of back difficulties.  Past Medical History:  Diagnosis Date  . Asthma    Past Surgical History:  Procedure Laterality Date  . ANTERIOR CRUCIATE LIGAMENT REPAIR Right 2010  . TONSILLECTOMY AND ADENOIDECTOMY     as a child    reports that he has never smoked. He has never used smokeless tobacco. He reports that he drinks alcohol. He reports that he does not use drugs. family history includes Cancer in his father; Heart disease in his father, maternal grandfather, maternal grandmother, paternal grandfather, and paternal grandmother; Hypertension in his father. Allergies  Allergen Reactions  . Phenergan [Promethazine Hcl] Nausea And Vomiting     Review of Systems  Constitutional: Negative for activity change, appetite change and fever.  Respiratory: Negative for cough and shortness of breath.   Cardiovascular: Negative for chest pain and leg swelling.  Gastrointestinal: Negative for abdominal pain and vomiting.  Genitourinary: Negative for dysuria, flank pain and hematuria.  Musculoskeletal: Positive for back pain. Negative for joint swelling.  Neurological: Negative for weakness and numbness.       Objective:   Physical Exam  Constitutional: He appears well-developed and well-nourished.  Cardiovascular: Normal rate and regular rhythm.  Pulmonary/Chest: Effort normal and  breath sounds normal.  Musculoskeletal:  Full range of motion right hip without difficulty. No pain with rotational. No localized spinal tenderness. Straight leg raise produces some pain going down the right lower extremity  Neurological:  Full-strength lower extremities. 2+ reflexes knee and ankle bilaterally       Assessment:     #1 acute right lower lumbar back pain with radiculitis type symptoms and nonfocal neuro exam  #2 attention deficit disorder    Plan:     -Refilled Adderall for 3 months -Trial of prednisone taper for his back pain -Flexeril 10 mg daily at bedtime when necessary -We reviewed some home extension stretches to try -Continue heat and ice for symptom relief -Touch base in 2 weeks if pain not improving. Consider trial physical therapy if not improved with the above  [name] W Anaiz Qazi MD [facility]

## 2017-12-30 NOTE — Patient Instructions (Signed)

## 2018-01-04 ENCOUNTER — Encounter: Payer: Self-pay | Admitting: Family Medicine

## 2018-01-04 ENCOUNTER — Emergency Department (HOSPITAL_BASED_OUTPATIENT_CLINIC_OR_DEPARTMENT_OTHER)
Admission: EM | Admit: 2018-01-04 | Discharge: 2018-01-04 | Disposition: A | Discharge status: Home / Self Care | Payer: BLUE CROSS/BLUE SHIELD | Attending: Emergency Medicine | Admitting: Emergency Medicine

## 2018-01-04 ENCOUNTER — Other Ambulatory Visit: Payer: Self-pay

## 2018-01-04 ENCOUNTER — Encounter (HOSPITAL_BASED_OUTPATIENT_CLINIC_OR_DEPARTMENT_OTHER): Payer: Self-pay | Admitting: Student

## 2018-01-04 DIAGNOSIS — F909 Attention-deficit hyperactivity disorder, unspecified type: Secondary | ICD-10-CM | POA: Insufficient documentation

## 2018-01-04 DIAGNOSIS — J4520 Mild intermittent asthma, uncomplicated: Secondary | ICD-10-CM | POA: Insufficient documentation

## 2018-01-04 DIAGNOSIS — R110 Nausea: Secondary | ICD-10-CM | POA: Diagnosis not present

## 2018-01-04 DIAGNOSIS — N3001 Acute cystitis with hematuria: Secondary | ICD-10-CM | POA: Insufficient documentation

## 2018-01-04 DIAGNOSIS — R300 Dysuria: Secondary | ICD-10-CM | POA: Diagnosis not present

## 2018-01-04 DIAGNOSIS — Z79899 Other long term (current) drug therapy: Secondary | ICD-10-CM | POA: Diagnosis not present

## 2018-01-04 DIAGNOSIS — M545 Low back pain: Secondary | ICD-10-CM | POA: Diagnosis not present

## 2018-01-04 LAB — BASIC METABOLIC PANEL
Anion gap: 7 (ref 5–15)
BUN: 12 mg/dL (ref 6–20)
CHLORIDE: 104 mmol/L (ref 98–111)
CO2: 29 mmol/L (ref 22–32)
CREATININE: 0.68 mg/dL (ref 0.61–1.24)
Calcium: 8.6 mg/dL — ABNORMAL LOW (ref 8.9–10.3)
GFR calc non Af Amer: >60 mL/min (ref >60)
Glucose, Bld: 91 mg/dL (ref 70–99)
Potassium: 4 mmol/L (ref 3.5–5.1)
SODIUM: 140 mmol/L (ref 135–145)

## 2018-01-04 LAB — CBC
HEMATOCRIT: 39.6 % (ref 39.0–52.0)
HEMOGLOBIN: 13.9 g/dL (ref 13.0–17.0)
MCH: 31.2 pg (ref 26.0–34.0)
MCHC: 35.1 g/dL (ref 30.0–36.0)
MCV: 88.8 fL (ref 78.0–100.0)
Platelets: 165 10*3/uL (ref 150–400)
RBC: 4.46 MIL/uL (ref 4.22–5.81)
RDW: 13 % (ref 11.5–15.5)
WBC: 12.1 10*3/uL — ABNORMAL HIGH (ref 4.0–10.5)

## 2018-01-04 LAB — URINALYSIS, ROUTINE W REFLEX MICROSCOPIC
Bilirubin Urine: NEGATIVE
GLUCOSE, UA: NEGATIVE mg/dL
KETONES UR: NEGATIVE mg/dL
Nitrite: POSITIVE — AB
PROTEIN: NEGATIVE mg/dL
Specific Gravity, Urine: <1.005 — ABNORMAL LOW (ref 1.005–1.030)
pH: 6.5 (ref 5.0–8.0)

## 2018-01-04 LAB — URINALYSIS, MICROSCOPIC (REFLEX): Squamous Epithelial / LPF: NONE SEEN (ref 0–5)

## 2018-01-04 MED ORDER — NAPROXEN 375 MG PO TABS: 375.0000 mg | ORAL_TABLET | Freq: Two times a day (BID) | ORAL | 0 refills | Status: DC

## 2018-01-04 MED ORDER — SODIUM CHLORIDE 0.9 % IV BOLUS
1000.0000 mL | Freq: Once | INTRAVENOUS | Status: AC
  Administered 2018-01-04: 1000 mL via INTRAVENOUS

## 2018-01-04 MED ORDER — CEPHALEXIN 500 MG PO CAPS: 500.0000 mg | ORAL_CAPSULE | Freq: Four times a day (QID) | ORAL | 0 refills | Status: DC

## 2018-01-04 MED ORDER — KETOROLAC TROMETHAMINE 15 MG/ML IJ SOLN
15.0000 mg | Freq: Once | INTRAMUSCULAR | Status: AC
  Administered 2018-01-04: 15 mg via INTRAVENOUS
  Filled 2018-01-04: qty 1

## 2018-01-04 MED ORDER — CEFTRIAXONE SODIUM 1 G IJ SOLR
1.0000 g | Freq: Once | INTRAMUSCULAR | Status: AC
  Administered 2018-01-04: 1 g via INTRAVENOUS
  Filled 2018-01-04: qty 10

## 2018-01-04 NOTE — ED Notes (Signed)
ED Provider at bedside. 

## 2018-01-04 NOTE — ED Provider Notes (Signed)
[facility] EMERGENCY DEPARTMENT Provider Note   CSN: [id number] Arrival date & time: 01/04/18  [id number]     History   Chief Complaint Chief Complaint  Patient presents with  . Back Pain    HPI Jeffrey [name] is a 37 y.o. male with a hx of asthma and ADD who presents to the ED with complaints of lower back pain x 1 week and urinary sxs that started over last couple of days. Patient states that he was at the gym exercising about a week ago and felt that he may have pulled a muscle in his right lower back pain. He relays since then he has had fairly constant pain to the R lower back that occasionally radiates to the L lower back. States this felt somewhat similar to previous sciatica problems, he saw his PCP, was given prednisone and flexeril which he has been taking as prescribed. He has also tried advil. He feels the pain seems to be worsening overall, some relief with flexiril, worse with certain movement/bending. Current pain is a 7/10 in severity. He has developed some urinary sxs over past couple of days including frequency, urgency, and dysuria. He reports some minimal nausea and possible chills. He has a family hx of nephrolithiasis therefore this is concerning him at the moment, no personal hx of this. Denies fever, vomiting, diarrhea, blood in stool, painful bowel movements, hematuria, testicular pain/swelling, or penile discharge. Denies numbness, tingling, weakness, incontinence to bowel/bladder, fever,  IV drug use, or hx of cancer. In a monogamous relationship, sexually active, no concern for STDs.    HPI  Past Medical History:  Diagnosis Date  . Asthma     Patient Active Problem List   Diagnosis Date Noted  . Obesity (BMI 30-39.9) 07/16/2014  . ADD (attention deficit disorder) 11/14/2012  . 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 Asthma, mild intermittent 10/01/2012    Past Surgical History:  Procedure Laterality Date  . ANTERIOR CRUCIATE LIGAMENT REPAIR Right 2010    . TONSILLECTOMY AND ADENOIDECTOMY     as a child        Home Medications    Prior to Admission medications   Medication Sig Start Date End Date Taking? Authorizing Provider  albuterol (VENTOLIN HFA) 108 (90 Base) MCG/ACT inhaler Inhale 1-2 puffs into the lungs every 6 (six) hours as needed for wheezing or shortness of breath. 08/14/16   Burchette, Elberta FortisBruce W, MD  amphetamine-dextroamphetamine (ADDERALL) 20 MG tablet Take 1 tablet (20 mg total) by mouth 2 (two) times daily. 12/30/17   Burchette, Elberta FortisBruce W, MD  amphetamine-dextroamphetamine (ADDERALL) 20 MG tablet Take 1 tablet (20 mg total) by mouth 2 (two) times daily. May refill in two months. 12/30/17   Burchette, Elberta FortisBruce W, MD  amphetamine-dextroamphetamine (ADDERALL) 20 MG tablet Take 1 tablet (20 mg total) by mouth 2 (two) times daily. May refill in one month. 12/30/17   Burchette, Elberta FortisBruce W, MD  augmented betamethasone dipropionate (DIPROLENE-AF) 0.05 % ointment Apply topically 2 (two) times daily. 12/30/17   Burchette, Elberta FortisBruce W, MD  cyclobenzaprine (FLEXERIL) 10 MG tablet Take 1 tablet (10 mg total) by mouth 3 (three) times daily as needed for muscle spasms. 12/30/17   Burchette, Elberta FortisBruce W, MD  Desoximetasone (TOPICORT) 0.25 % ointment Apply 1 application topically 2 (two) times daily as needed. 07/12/17   Burchette, Elberta FortisBruce W, MD  LORazepam (ATIVAN) 0.5 MG tablet Take 1 tablet (0.5 mg total) by mouth every 8 (eight) hours as needed for anxiety. 08/26/17   Burchette,  Elberta Fortis, MD  Multiple Vitamins-Minerals (MENS MULTI VITAMIN & MINERAL PO) Take by mouth daily.    [provider]  naproxen sodium (ANAPROX) 220 MG tablet Take 220 mg by mouth 2 (two) times daily with a meal.    [provider]  predniSONE (DELTASONE) 10 MG tablet Taper as follows: 4-4-4-3-3-2-2 12/30/17   Burchette, Elberta Fortis, MD    Family History Family History  Problem Relation Age of Onset  . Cancer Father        prostate  . Heart disease Father   . Hypertension  Father   . Heart disease Maternal Grandmother   . Heart disease Maternal Grandfather   . Heart disease Paternal Grandmother   . Heart disease Paternal Grandfather     Social History Social History   Tobacco Use  . Smoking status: Never Smoker  . Smokeless tobacco: Never Used  Substance Use Topics  . Alcohol use: Yes    Comment: occasional  . Drug use: No     Allergies   Phenergan [promethazine hcl]   Review of Systems Review of Systems  Constitutional: Positive for chills. Negative for fever.  Respiratory: Negative for shortness of breath.   Cardiovascular: Negative for chest pain.  Gastrointestinal: Positive for nausea. Negative for abdominal pain, blood in stool, constipation, diarrhea and vomiting.  Genitourinary: Positive for dysuria, frequency and urgency. Negative for discharge, hematuria, scrotal swelling and testicular pain.  Musculoskeletal: Positive for back pain.  Skin: Negative for rash and wound.  Neurological: Negative for weakness and numbness.       Negative for incontinence to bowel or bladder. Negative for saddle anesthesia.   All other systems reviewed and are negative.    Physical Exam Updated Vital Signs There were no vitals taken for this visit.  Physical Exam  Constitutional: He appears well-developed and well-nourished. No distress.  HENT:  Head: Normocephalic and atraumatic.  Eyes: Conjunctivae are normal. Right eye exhibits no discharge. Left eye exhibits no discharge.  Cardiovascular: Normal rate and regular rhythm.  No murmur heard. Pulmonary/Chest: Breath sounds normal. No respiratory distress. He has no wheezes. He has no rales.  Abdominal: Soft. Bowel sounds are normal. He exhibits no distension. There is no tenderness. There is no rebound, no guarding and no CVA tenderness.  Genitourinary: Cremasteric reflex is present. Right testis shows no mass, no swelling and no tenderness. Left testis shows no mass, no swelling and no  tenderness. Circumcised. No penile erythema. No discharge found.  Genitourinary Comments: RN present as chaperone.   Musculoskeletal:  No obvious deformity, overlying erythema, ecchymosis, or warmth. No appreciable swelling. No midline tenderness to the spine. He does have right lumbar paraspinal muscle tenderness to palpation.   Neurological: He is alert.  Clear speech. Sensation grossly intact to bilateral lower extremities. 5/5 strength with hip flexion/extension and ankle plantar/dorsi flexion. Gait steady and intact.   Skin: Skin is warm and dry. No rash noted.  Psychiatric: He has a normal mood and affect. His behavior is normal.  Nursing note and vitals reviewed.  ED Treatments / Results  Labs Results for orders placed or performed during the hospital encounter of 01/04/18  Urinalysis, Routine w reflex microscopic- may I&O cath if menses  Result Value Ref Range   Color, Urine YELLOW YELLOW   APPearance HAZY (A) CLEAR   Specific Gravity, Urine <1.005 (L) 1.005 - 1.030   pH 6.5 5.0 - 8.0   Glucose, UA NEGATIVE NEGATIVE mg/dL   Hgb urine dipstick MODERATE (A)  NEGATIVE   Bilirubin Urine NEGATIVE NEGATIVE   Ketones, ur NEGATIVE NEGATIVE mg/dL   Protein, ur NEGATIVE NEGATIVE mg/dL   Nitrite POSITIVE (A) NEGATIVE   Leukocytes, UA MODERATE (A) NEGATIVE  CBC  Result Value Ref Range   WBC 12.1 (H) 4.0 - 10.5 K/uL   RBC 4.46 4.22 - 5.81 MIL/uL   Hemoglobin 13.9 13.0 - 17.0 g/dL   HCT 16.1 09.6 - 04.5 %   MCV 88.8 78.0 - 100.0 fL   MCH 31.2 26.0 - 34.0 pg   MCHC 35.1 30.0 - 36.0 g/dL   RDW 40.9 81.1 - 91.4 %   Platelets 165 150 - 400 K/uL  Basic metabolic panel  Result Value Ref Range   Sodium 140 135 - 145 mmol/L   Potassium 4.0 3.5 - 5.1 mmol/L   Chloride 104 98 - 111 mmol/L   CO2 29 22 - 32 mmol/L   Glucose, Bld 91 70 - 99 mg/dL   BUN 12 6 - 20 mg/dL   Creatinine, Ser 7.82 0.61 - 1.24 mg/dL   Calcium 8.6 (L) 8.9 - 10.3 mg/dL   GFR calc non Af Amer >60 >60 mL/min   GFR  calc Af Amer >60 >60 mL/min   Anion gap 7 5 - 15  Urinalysis, Microscopic (reflex)  Result Value Ref Range   RBC / HPF 0-5 0 - 5 RBC/hpf   WBC, UA >50 0 - 5 WBC/hpf   Bacteria, UA MANY (A) NONE SEEN   Squamous Epithelial / LPF NONE SEEN 0 - 5   Non Squamous Epithelial PRESENT (A) NONE SEEN   No results found. EKG None  Radiology No results found.  Procedures Procedures (including critical care time)  Medications Ordered in ED Medications  sodium chloride 0.9 % bolus 1,000 mL (1,000 mLs Intravenous New Bag/Given 01/04/18 1126)  cefTRIAXone (ROCEPHIN) 1 g in sodium chloride 0.9 % 100 mL IVPB (1 g Intravenous New Bag/Given 01/04/18 1125)     Initial Impression / Assessment and Plan / ED Course  I have reviewed the triage vital signs and the nursing notes.  Pertinent labs & imaging results that were available during my care of the patient were reviewed by me and considered in my medical decision making (see chart for details).   Patient presents to the ED with complaints of R lower back pain x 1 week and  urinary sxs over past few days. Patient nontoxic appearing, in no apparent distress, vitals WNL other than elevated BP, doubt HTN emergency, normalized on recheck. Will obtain basic labs with UA.   UA appears consistent with infection, difficult to ascertain UTI vs. Pyelo given timeline/patient hx, may be a component of muscle spasm/strain. Basic labs notable for leukocytosis at 12.1, mild hypocalcemia at 8.6, otherwise unremarkable. Renal function WNL. Given hx of pain progression, no hx of stones, doubt nephrolithiasis. Additionally considered  disc disease, aortic aneurysm/dissection, cauda equina or epidural abscess however these do not fit clinical picture at this time. Additionally no testicular pain/tenderness to raise concern for orchitis/epididymitis. No abdominal pain or pain with bowel movements to raise concern for prostatitis. Patient in a monogamous relationship with wife,  doubt STDs. Tolerating PO, renal function WNL, afebrile. Given dose of IV rocephin and toradol in the ER. Will discharge home on keflex with prescription for naproxen for pain. I discussed results, treatment plan, need for PCP follow-up, and return precautions with the patient. Provided opportunity for questions, patient confirmed understanding and is in agreement with plan.  Vitals:   01/04/18 0957 01/04/18 1220  BP: (!) 143/91 106/89  Pulse: 100 89  Resp: 19 16  Temp: 98.2 F (36.8 C)   SpO2: 100% 99%    Final Clinical Impressions(s) / ED Diagnoses   Final diagnoses:  Acute cystitis with hematuria    ED Discharge Orders        Ordered    cephALEXin (KEFLEX) 500 MG capsule  4 times daily     01/04/18 1216    naproxen (NAPROSYN) 375 MG tablet  2 times daily     01/04/18 [address] [place], PA-C 01/04/18 1903    Gwyneth Sprout, MD 01/06/18 2015

## 2018-01-04 NOTE — ED Triage Notes (Signed)
R low back pain x 1 week. Saw PCP and given prednisone and muscles relaxer's without relief. Now reporting pressure when he urinates. He is concerned about a kidney stone.

## 2018-01-04 NOTE — Discharge Instructions (Signed)
You were seen in the emergency department today for urinary symptoms and back pain.  It appears that you have a urinary tract infection, it is possible this spread to your kidney.   We are treating this infection with an antibiotic, Keflex. Please take all of your antibiotics until finished. You may develop abdominal discomfort or diarrhea from the antibiotic.  You may help offset this with probiotics which you can buy at the store (ask your pharmacist if unable to find) or get probiotics in the form of eating yogurt. Do not eat or take the probiotics until 2 hours after your antibiotic. If you are unable to tolerate these side effects follow-up with your primary care provider or return to the emergency department.   If you begin to experience any blistering, rashes, swelling, or difficulty breathing seek medical care for evaluation of potentially more serious side effects.   Please be aware that this medication may interact with other medications you are taking, please be sure to discuss your medication list with your pharmacist.   We are additionally sending you home with a prescription for naproxen. Naproxen is a nonsteroidal anti-inflammatory medication that will help with pain and swelling. Be sure to take this medication as prescribed with food, 1 pill every 12 hours,  It should be taken with food, as it can cause stomach upset, and more seriously, stomach bleeding. Do not take other nonsteroidal anti-inflammatory medications with this such as Advil, Motrin, or Aleve.   You may take tylenol per over the counter dosing safely with this medicine. You may also take your flexeril safely with this.    Please follow-up with your primary care provider in 5 to 7 days.  Return to the ER anytime for new or worsening symptoms including but not limited to fever, vomiting, worsening pain, or any other concerns.

## 2018-01-05 ENCOUNTER — Encounter: Payer: Self-pay | Admitting: Family Medicine

## 2018-01-06 ENCOUNTER — Encounter: Payer: Self-pay | Admitting: Family Medicine

## 2018-01-06 ENCOUNTER — Telehealth: Payer: Self-pay | Admitting: Family Medicine

## 2018-01-06 ENCOUNTER — Ambulatory Visit: Payer: BLUE CROSS/BLUE SHIELD | Admitting: Family Medicine

## 2018-01-06 VITALS — BP 126/70 | HR 89 | Temp 98.6°F | Ht 71.0 in | Wt 219.1 lb

## 2018-01-06 DIAGNOSIS — M545 Low back pain, unspecified: Secondary | ICD-10-CM

## 2018-01-06 DIAGNOSIS — R300 Dysuria: Secondary | ICD-10-CM

## 2018-01-06 LAB — POCT URINALYSIS DIPSTICK
BILIRUBIN UA: NEGATIVE
Blood, UA: NEGATIVE
Glucose, UA: NEGATIVE
Ketones, UA: NEGATIVE
Leukocytes, UA: NEGATIVE
Nitrite, UA: NEGATIVE
Protein, UA: NEGATIVE
Spec Grav, UA: <=1.005 — AB (ref 1.010–1.025)
UROBILINOGEN UA: 0.2 U/dL
pH, UA: 5 (ref 5.0–8.0)

## 2018-01-06 NOTE — Progress Notes (Addendum)
  Subjective:     Patient ID: Jeffrey [name], male   DOB: 1980-07-31, 37 y.o.   MRN: [id number]  HPI Patient seen for follow-up following recent ER visit with probable UTI. He was seen here recently with back pain but denied any urinary symptoms at that point. He then few days ago developed some urine urgency and burning with urination. His pain has been more right lower lumbar vs flank. He had some lab work and urinalysis which showed leukocytes along with moderate blood and positive nitrites. White blood count 12.1 thousand. Electrolytes all normal. Urine microscopy showed many bacteria and greater than 50 white blood cells.  No prior history of UTI. Patient was given Rocephin 1 g and started on Keflex. He states he had fever up to 103 after he got home from the ER but has been more low-grade since then. He still has some burning with urination. Drink fluids well. No nausea or vomiting. Afebrile currently.  Patient is monogamous. No history of prostatitis. No recent abdominal pain.  Past Medical History:  Diagnosis Date  . Asthma    Past Surgical History:  Procedure Laterality Date  . ANTERIOR CRUCIATE LIGAMENT REPAIR Right 2010  . TONSILLECTOMY AND ADENOIDECTOMY     as a child    reports that he has never smoked. He has never used smokeless tobacco. He reports that he drinks alcohol. He reports that he does not use drugs. family history includes Cancer in his father; Heart disease in his father, maternal grandfather, maternal grandmother, paternal grandfather, and paternal grandmother; Hypertension in his father. Allergies  Allergen Reactions  . 2,4-D Dimethylamine (Amisol) Hives  . Phenergan [Promethazine Hcl] Nausea And Vomiting     Review of Systems  Constitutional: Positive for chills, fatigue and fever.  Respiratory: Negative for cough and shortness of breath.   Cardiovascular: Negative for chest pain.  Gastrointestinal: Negative for abdominal pain, nausea and vomiting.   Genitourinary: Positive for dysuria.       Objective:   Physical Exam  Constitutional: He appears well-developed and well-nourished.  Cardiovascular: Normal rate and regular rhythm.  Pulmonary/Chest: Effort normal and breath sounds normal.  Abdominal: Soft. Bowel sounds are normal.  Genitourinary:  Genitourinary Comments: Prostate is nontender to palpation       Assessment:     Recent urinary tract infection. No clear evidence for pyelonephritis.  No evidence for acute prostatitis. Patient still has some persistent urinary symptoms at this time in spite of recent Rocephin and Keflex.  Urine dip today normal.    Plan:     -Recheck urinalysis. If abnormal, consider culture even though yield may be lower with current antibiotic use  Urine dip is normal.   Suggest he try some Pyridium OTC  Finish out the Keflex.  Be in touch if fever not fully relieved in 2 days.  [name] W Burchette MD [facility]  Spoke with patient today. His fever has resolved. He does have some less discomfort with Pyridium but still feels he is having to strain occasionally to get urine out still has some slow stream. We recommended short-term trial of Flomax 0.4 mg 1 daily at bedtime and touch base in 3 days if symptoms not resolving fully. He did not have evidence of acute prostatitis by exam recently  [name] W Burchette MD [place] [facility]

## 2018-01-06 NOTE — Patient Instructions (Signed)
Continue to stay well hydrated  Finish out the Keflex  Consider OTC Pyridium (Azostandard)  Monitor temp and be in touch if any persistent fever.  Follow up for any recurrent nausea, vomiting, or any progressive abdominal pain.

## 2018-01-06 NOTE — Telephone Encounter (Signed)
Copied from CRM [phone]#[id number]. Topic: General - Other >> Jan 06, 2018  8:08 AM Percival SpanishKennedy, Cheryl W wrote:  Pt went to the ER over the weekend and has a UTI and  Kidney infection and need as fup.I scheduled him for Wednesday but he wants to come in sooner and wanted me to ask Dr Caryl NeverBurchette if it will bo ok to for him to wait till Wednesday

## 2018-01-06 NOTE — Telephone Encounter (Signed)
Appointment made

## 2018-01-07 ENCOUNTER — Encounter: Payer: Self-pay | Admitting: Family Medicine

## 2018-01-07 MED ORDER — TAMSULOSIN HCL 0.4 MG PO CAPS: 0.4000 mg | ORAL_CAPSULE | Freq: Every day | ORAL | 0 refills | Status: DC

## 2018-01-07 NOTE — Addendum Note (Signed)
Addended by: Kristian CoveyBURCHETTE, Keli Buehner W on: 01/07/2018 05:33 PM   Modules accepted: Orders

## 2018-01-08 ENCOUNTER — Encounter: Payer: Self-pay | Admitting: Family Medicine

## 2018-01-08 ENCOUNTER — Inpatient Hospital Stay: Payer: BLUE CROSS/BLUE SHIELD | Admitting: Family Medicine

## 2018-01-08 LAB — URINE CULTURE
MICRO NUMBER: 90864461
RESULT: NO GROWTH
SPECIMEN QUALITY: ADEQUATE

## 2018-01-09 ENCOUNTER — Encounter: Payer: Self-pay | Admitting: Family Medicine

## 2018-01-09 DIAGNOSIS — N4889 Other specified disorders of penis: Secondary | ICD-10-CM

## 2018-01-09 DIAGNOSIS — N390 Urinary tract infection, site not specified: Secondary | ICD-10-CM

## 2018-01-10 ENCOUNTER — Ambulatory Visit: Payer: BLUE CROSS/BLUE SHIELD | Admitting: Family Medicine

## 2018-01-10 ENCOUNTER — Encounter: Payer: Self-pay | Admitting: Family Medicine

## 2018-01-17 DIAGNOSIS — N3000 Acute cystitis without hematuria: Secondary | ICD-10-CM | POA: Diagnosis not present

## 2018-02-13 ENCOUNTER — Encounter: Payer: Self-pay | Admitting: Family Medicine

## 2018-02-21 ENCOUNTER — Ambulatory Visit: Payer: BLUE CROSS/BLUE SHIELD | Admitting: Family Medicine

## 2018-02-21 ENCOUNTER — Encounter: Payer: Self-pay | Admitting: Family Medicine

## 2018-02-21 VITALS — BP 110/70 | HR 96 | Temp 98.4°F | Wt 213.5 lb

## 2018-02-21 DIAGNOSIS — Z23 Encounter for immunization: Secondary | ICD-10-CM

## 2018-02-21 DIAGNOSIS — M545 Low back pain, unspecified: Secondary | ICD-10-CM

## 2018-02-21 NOTE — Progress Notes (Signed)
  Subjective:     Patient ID: Jeffrey [name], male   DOB: 1980/11/11, 37 y.o.   MRN: [id number]  HPI Follow-up regarding low back pain. Patient had severe case of prostatitis which eventually cleared with antibiotics several weeks ago. His back pain is actually improved in comparison with last week. He's been doing some stretching which he thinks has helped. His pain has been lower lumbar region. No radiculitis symptoms. No weakness. No numbness. He has a newborn 20-week-old infant and this has reduced his exercise some recently.  He had Tdap back in 2016. Still needs flu vaccine.  Past Medical History:  Diagnosis Date  . Asthma    Past Surgical History:  Procedure Laterality Date  . ANTERIOR CRUCIATE LIGAMENT REPAIR Right 2010  . TONSILLECTOMY AND ADENOIDECTOMY     as a child    reports that he has never smoked. He has never used smokeless tobacco. He reports that he drinks alcohol. He reports that he does not use drugs. family history includes Cancer in his father; Heart disease in his father, maternal grandfather, maternal grandmother, paternal grandfather, and paternal grandmother; Hypertension in his father. Allergies  Allergen Reactions  . 2,4-D Dimethylamine (Amisol) Hives  . Phenergan [Promethazine Hcl] Nausea And Vomiting     Review of Systems  Constitutional: Negative for activity change, appetite change and fever.  Respiratory: Negative for cough and shortness of breath.   Cardiovascular: Negative for chest pain and leg swelling.  Gastrointestinal: Negative for abdominal pain and vomiting.  Genitourinary: Negative for dysuria, flank pain and hematuria.  Musculoskeletal: Positive for back pain. Negative for joint swelling.  Neurological: Negative for weakness and numbness.       Objective:   Physical Exam  Constitutional: He is oriented to person, place, and time. He appears well-developed and well-nourished. No distress.  Neck: No thyromegaly present.   Cardiovascular: Normal rate, regular rhythm and normal heart sounds.  No murmur heard. Pulmonary/Chest: Effort normal and breath sounds normal. No respiratory distress. He has no wheezes. He has no rales.  Musculoskeletal: He exhibits no edema.  Neurological: He is alert and oriented to person, place, and time. He has normal reflexes. No cranial nerve deficit.  Skin: No rash noted.       Assessment:     Low back pain. Improved.    Plan:     -discussed issues regarding prevention including weight control, adequate stretching, good core strengthening, proper posture -Flu vaccine given.  His Tdap is up to date.  Kristian Covey MD [facility] at [facility]

## 2018-02-28 DIAGNOSIS — F4321 Adjustment disorder with depressed mood: Secondary | ICD-10-CM | POA: Diagnosis not present

## 2018-03-04 ENCOUNTER — Encounter: Payer: Self-pay | Admitting: Family Medicine

## 2018-04-11 DIAGNOSIS — F4321 Adjustment disorder with depressed mood: Secondary | ICD-10-CM | POA: Diagnosis not present

## 2018-04-11 DIAGNOSIS — L255 Unspecified contact dermatitis due to plants, except food: Secondary | ICD-10-CM | POA: Diagnosis not present

## 2018-04-22 ENCOUNTER — Other Ambulatory Visit: Payer: Self-pay

## 2018-04-22 ENCOUNTER — Ambulatory Visit: Payer: BLUE CROSS/BLUE SHIELD | Admitting: Family Medicine

## 2018-04-22 ENCOUNTER — Encounter: Payer: Self-pay | Admitting: Family Medicine

## 2018-04-22 VITALS — BP 130/74 | HR 97 | Temp 98.7°F | Ht 71.0 in | Wt 209.4 lb

## 2018-04-22 DIAGNOSIS — L259 Unspecified contact dermatitis, unspecified cause: Secondary | ICD-10-CM

## 2018-04-22 DIAGNOSIS — J069 Acute upper respiratory infection, unspecified: Secondary | ICD-10-CM

## 2018-04-22 DIAGNOSIS — F988 Other specified behavioral and emotional disorders with onset usually occurring in childhood and adolescence: Secondary | ICD-10-CM

## 2018-04-22 MED ORDER — AMPHETAMINE-DEXTROAMPHETAMINE 20 MG PO TABS: 20.0000 mg | ORAL_TABLET | Freq: Two times a day (BID) | ORAL | 0 refills | Status: DC

## 2018-04-22 MED ORDER — TRIAMCINOLONE ACETONIDE 0.1 % EX CREA: 1.0000 "application " | TOPICAL_CREAM | Freq: Two times a day (BID) | CUTANEOUS | 1 refills | Status: DC | PRN

## 2018-04-22 NOTE — Patient Instructions (Signed)
Consider moisturizer such as Cetaphil, Eucerin, or Lac-Hydrin.

## 2018-04-22 NOTE — Progress Notes (Signed)
  Subjective:     Patient ID: Jeffrey [name], male   DOB: 1981/04/08, 37 y.o.   MRN: [id number]  HPI Patient seen for the following issues  Diffuse rash on his trunk and extremities.  He states this first started a few weeks ago.  He did a telemedicine consult and received some oral prednisone.  His symptoms continued about 2 weeks ago he went to urgent care and was given shot presumably of Depo-Medrol.  He had some slight improvement.  He still has diffuse slightly raised pruritic rash on his extremities and trunk.  He does have history of atopic dermatitis and wonders some his rash may be related to that.  Second issue is acute upper respiratory symptoms.  Onset over the weekend with sore throat and nasal congestion with body aches.  He has some dry cough.  No fever.  They have a infant daughter at home but she is doing well thus far.  Third issue is requesting refills of Adderall.  Denies any side effects from medication.  Past Medical History:  Diagnosis Date  . Asthma    Past Surgical History:  Procedure Laterality Date  . ANTERIOR CRUCIATE LIGAMENT REPAIR Right 2010  . TONSILLECTOMY AND ADENOIDECTOMY     as a child    reports that he has never smoked. He has never used smokeless tobacco. He reports that he drinks alcohol. He reports that he does not use drugs. family history includes Cancer in his father; Heart disease in his father, maternal grandfather, maternal grandmother, paternal grandfather, and paternal grandmother; Hypertension in his father. Allergies  Allergen Reactions  . 2,4-D Dimethylamine (Amisol) Hives  . Phenergan [Promethazine Hcl] Nausea And Vomiting     Review of Systems  Constitutional: Positive for fatigue. Negative for chills and fever.  HENT: Positive for congestion and sore throat.   Respiratory: Positive for cough. Negative for shortness of breath and wheezing.   Cardiovascular: Negative for chest pain.  Skin: Positive for rash.       Objective:    Physical Exam  Constitutional: He appears well-developed and well-nourished.  HENT:  Mouth/Throat: Oropharynx is clear and moist. No oropharyngeal exudate, posterior oropharyngeal edema or posterior oropharyngeal erythema.  Neck: Normal range of motion.  Cardiovascular: Normal rate and regular rhythm.  Pulmonary/Chest: Effort normal and breath sounds normal. He has no wheezes. He has no rales.  Lymphadenopathy:    He has no cervical adenopathy.  Skin: Rash noted.  Patient has erythematous slightly raised rash with a couple of areas of vesicles involving mostly upper extremities with forearms.  He has some dry scaly erythematous rash left trunk region       Assessment:     #1 viral URI.  #2 contact dermatitis in the setting of chronic atopic dermatitis  #3 attention deficit disorder    Plan:     -Plenty of fluids and rest and symptomatic treatment regarding his viral URI.  Follow-up promptly for any fever or change of symptoms  -Recommend triamcinolone 0.1% cream twice daily as needed for dermatitis symptoms and use of moisturizer cream as needed  -Refilled Adderall for 3 months  Kristian Covey MD [facility] at [facility]

## 2018-04-24 ENCOUNTER — Ambulatory Visit: Payer: BLUE CROSS/BLUE SHIELD | Admitting: Family Medicine

## 2018-04-24 ENCOUNTER — Encounter: Payer: Self-pay | Admitting: Family Medicine

## 2018-04-24 VITALS — BP 102/74 | HR 81 | Temp 98.7°F | Wt 212.0 lb

## 2018-04-24 DIAGNOSIS — R600 Localized edema: Secondary | ICD-10-CM

## 2018-04-24 MED ORDER — AMOXICILLIN 500 MG PO CAPS: 500.0000 mg | ORAL_CAPSULE | Freq: Two times a day (BID) | ORAL | 0 refills | Status: AC

## 2018-04-24 NOTE — Progress Notes (Signed)
Subjective:    Patient ID: Jeffrey [name], male    DOB: 01-12-1981, 37 y.o.   MRN: [id number]  No chief complaint on file.   HPI Patient was seen today for acute concern.  Pt endorses right-sided facial swelling which started last night.  Pt denies injury, insect bite, dental pain, facial pain.  Pt went to the dentist 2 weeks ago and had a normal checkup.  Pt notes h/o TMJ, but states the swelling is not in the usual area.  Pt does note recent poison ivy exposure on his left TM for which she has been taking steroid cream and p.o. steroids.  Pt denies fever, chills, nausea, vomiting, cough, rash.  Past Medical History:  Diagnosis Date  . Asthma     Allergies  Allergen Reactions  . 2,4-D Dimethylamine (Amisol) Hives  . Phenergan [Promethazine Hcl] Nausea And Vomiting    ROS General: Denies fever, chills, night sweats, changes in weight, changes in appetite HEENT: Denies headaches, ear pain, changes in vision, rhinorrhea, sore throat  +R cheek swelling CV: Denies CP, palpitations, SOB, orthopnea Pulm: Denies SOB, cough, wheezing GI: Denies abdominal pain, nausea, vomiting, diarrhea, constipation GU: Denies dysuria, hematuria, frequency, vaginal discharge Msk: Denies muscle cramps, joint pains Neuro: Denies weakness, numbness, tingling Skin: Denies rashes, bruising Psych: Denies depression, anxiety, hallucinations    Objective:    Blood pressure 102/74, pulse 81, temperature 98.7 F (37.1 C), temperature source Oral, weight 212 lb (96.2 kg), SpO2 98 %.   Gen. Pleasant, well-nourished, in no distress, normal affect   HEENT: [place]/AT, face asymmetric R cheek mildly enlarged compared to L, conjunctiva clear, no scleral icterus, PERRLA, EOMI, nares patent without drainage, No TTP of R cheek and face.  Oral mucosa on R near molar with area of broken capillaries and small skin tear.  No carried noted, gingiva normal, not edematous.  Pharynx without erythema or exudate.  TMs normal b/l Neck:  No JVD, no cervical lymphadenopathy.  No thyromegaly, no carotid bruits Cardiovascular: RRR, no peripheral edema   Wt Readings from Last 3 Encounters:  04/24/18 212 lb (96.2 kg)  04/22/18 209 lb 6.4 oz (95 kg)  02/21/18 213 lb 8 oz (96.8 kg)    Lab Results  Component Value Date   WBC 12.1 (H) 01/04/2018   HGB 13.9 01/04/2018   HCT 39.6 01/04/2018   PLT 165 01/04/2018   GLUCOSE 91 01/04/2018   CHOL 118 12/03/2016   TRIG 73.0 12/03/2016   HDL 49.10 12/03/2016   LDLCALC 54 12/03/2016   ALT 9 12/03/2016   AST 13 12/03/2016   NA 140 01/04/2018   K 4.0 01/04/2018   CL 104 01/04/2018   CREATININE 0.68 01/04/2018   BUN 12 01/04/2018   CO2 29 01/04/2018   TSH 0.79 12/03/2016    Assessment/Plan:  Facial edema -exam with findings of erythema, broken blood vessels and small skin tear of oral mucosa on R side. -Discussed possible causes including abscess, caries, bite allergic reaction.  Explained mumps less likely face nonpainful. - Plan: amoxicillin (AMOXIL) 500 MG capsule -Given RTC precautions  Follow-up PRN  Abbe Amsterdam, MD

## 2018-04-24 NOTE — Patient Instructions (Signed)
Prescription antibiotic called amoxicillin.  You can take this twice a day for the next 7 days.  He also use ice on your face to reduce the swelling.  You can also take Tylenol or ibuprofen as needed for any pain or discomfort.  The inside of your mouth becomes sore you can use warm salt water to gargle with.  Please let us know if you develop any fever, increased swelling, increased pain, or worsening of symptoms.

## 2018-04-29 ENCOUNTER — Other Ambulatory Visit: Payer: Self-pay

## 2018-04-29 ENCOUNTER — Encounter: Payer: Self-pay | Admitting: Family Medicine

## 2018-04-29 MED ORDER — ALBUTEROL SULFATE HFA 108 (90 BASE) MCG/ACT IN AERS: 1.0000 | INHALATION_SPRAY | Freq: Four times a day (QID) | RESPIRATORY_TRACT | 2 refills | Status: DC | PRN

## 2018-05-02 ENCOUNTER — Encounter: Payer: Self-pay | Admitting: Family Medicine

## 2018-05-02 ENCOUNTER — Ambulatory Visit: Payer: BLUE CROSS/BLUE SHIELD | Admitting: Family Medicine

## 2018-05-02 VITALS — BP 110/72 | HR 96 | Temp 98.5°F | Wt 209.0 lb

## 2018-05-02 DIAGNOSIS — J101 Influenza due to other identified influenza virus with other respiratory manifestations: Secondary | ICD-10-CM | POA: Diagnosis not present

## 2018-05-02 DIAGNOSIS — R6883 Chills (without fever): Secondary | ICD-10-CM | POA: Diagnosis not present

## 2018-05-02 LAB — POCT INFLUENZA A/B
Influenza A, POC: POSITIVE — AB
Influenza B, POC: NEGATIVE

## 2018-05-02 NOTE — Addendum Note (Signed)
Addended by: Carola RhineKIGOTHO, Mckenzee Beem N on: 05/02/2018 12:40 PM   Modules accepted: Orders

## 2018-05-02 NOTE — Addendum Note (Signed)
Addended by: Carola RhineKIGOTHO, NANCY N on: 05/02/2018 12:41 PM   Modules accepted: Orders

## 2018-05-02 NOTE — Addendum Note (Signed)
Addended by: Carola RhineKIGOTHO, NANCY N on: 05/02/2018 12:43 PM   Modules accepted: Orders

## 2018-05-02 NOTE — Patient Instructions (Signed)

## 2018-05-02 NOTE — Progress Notes (Signed)
  Subjective:    Patient ID: Jeffrey [name], male    DOB: 07-Nov-1980, 37 y.o.   MRN: [id number]  No chief complaint on file.   HPI Patient was seen today for acute visit:  Cold-like symptoms: -Seen 11/5 for viral URI/contact dermatitis -seen again on 11/7 for a right-sided facial edema, started on Augmentin.  Facial edema resolved. -symptoms started last week w/ sore throat-now resolved -Endorses chest congestion, productive cough, chills, frontal headache, fever T-max 101, muscle soreness -Has taken Delsym, elderberry, zinc, Orange juice -Sick contacts--niece had "walking pneumonia" and possibly  clients at work. -pt received influenza vaccine this yr  Past Medical History:  Diagnosis Date  . Asthma     Allergies  Allergen Reactions  . 2,4-D Dimethylamine (Amisol) Hives  . Phenergan [Promethazine Hcl] Nausea And Vomiting    ROS General: Denies night sweats, changes in weight, changes in appetite  +fever, chills HEENT: Deniesear pain, changes in vision, rhinorrhea   +sore throat, frontal HA CV: Denies CP, palpitations, SOB, orthopnea Pulm: Denies SOB, wheezing  +cough, chest congestion GI: Denies abdominal pain, nausea, vomiting, diarrhea, constipation GU: Denies dysuria, hematuria, frequency, vaginal discharge Msk: Denies muscle cramps, joint pains  +myalgias Neuro: Denies weakness, numbness, tingling Skin: Denies rashes, bruising Psych: Denies depression, anxiety, hallucinations     Objective:    Blood pressure 110/72, pulse 96, temperature 98.5 F (36.9 C), temperature source Oral, weight 209 lb (94.8 kg), SpO2 98 %.   Gen. Pleasant, well-nourished, in no distress, normal affect   HEENT: [place]/AT, face symmetric, no scleral icterus, PERRLA, nares patent without drainage, pharynx with mild erythema or exudate. TMs full b/l.  No cervical lymphadenopathy Lungs: cough, no accessory muscle use, CTAB, no wheezes or rales Cardiovascular: RRR, no m/r/g, no peripheral  edema Neuro:  A&Ox3, CN II-XII intact, normal gait  Wt Readings from Last 3 Encounters:  05/02/18 209 lb (94.8 kg)  04/24/18 212 lb (96.2 kg)  04/22/18 209 lb 6.4 oz (95 kg)    Lab Results  Component Value Date   WBC 12.1 (H) 01/04/2018   HGB 13.9 01/04/2018   HCT 39.6 01/04/2018   PLT 165 01/04/2018   GLUCOSE 91 01/04/2018   CHOL 118 12/03/2016   TRIG 73.0 12/03/2016   HDL 49.10 12/03/2016   LDLCALC 54 12/03/2016   ALT 9 12/03/2016   AST 13 12/03/2016   NA 140 01/04/2018   K 4.0 01/04/2018   CL 104 01/04/2018   CREATININE 0.68 01/04/2018   BUN 12 01/04/2018   CO2 29 01/04/2018   TSH 0.79 12/03/2016    Assessment/Plan:  Influenza A -Discussed supportive care -Outside of window for antiviral treatment -Given handout -Given RTC or ED precautions for continued or worsening symptoms  Chills -Rapid influenza test positive  Follow-up PRN  Abbe AmsterdamShannon Lauris Serviss, MD

## 2018-05-18 DIAGNOSIS — R220 Localized swelling, mass and lump, head: Secondary | ICD-10-CM | POA: Diagnosis not present

## 2018-05-21 ENCOUNTER — Encounter: Payer: Self-pay | Admitting: Family Medicine

## 2018-05-21 ENCOUNTER — Ambulatory Visit: Payer: BLUE CROSS/BLUE SHIELD | Admitting: Family Medicine

## 2018-05-21 DIAGNOSIS — R220 Localized swelling, mass and lump, head: Secondary | ICD-10-CM

## 2018-05-21 NOTE — Patient Instructions (Signed)
Finish out the antibiotic   We will set up CT of sinuses to further evaluate.

## 2018-05-21 NOTE — Progress Notes (Signed)
  Subjective:     Patient ID: Jeffrey [name], male   DOB: 1981/04/11, 37 y.o.   MRN: [id number]  HPI Patient is seen with some recurrent right maxillary facial swelling.  He was seen here back in early November and had right maxillary facial swelling but no real pain.  No fever.  Was treated with amoxicillin and got some better.  He then had some recurrence of facial swelling and fatigue and this past Sunday went to urgent care was placed on Augmentin and prednisone.  He denies any recent bleeding.  No purulent discharge.  Occasional mild headache.  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  He has not had recurrent lung infections.  He did have case of cystitis last summer.  He has not noted any appetite or weight changes.  No neck adenopathy.  No recent parotid swelling.  No dental pain.  Past Medical History:  Diagnosis Date  . Asthma    Past Surgical History:  Procedure Laterality Date  . ANTERIOR CRUCIATE LIGAMENT REPAIR Right 2010  . TONSILLECTOMY AND ADENOIDECTOMY     as a child    reports that he has never smoked. He has never used smokeless tobacco. He reports that he drinks alcohol. He reports that he does not use drugs. family history includes Cancer in his father; Heart disease in his father, maternal grandfather, maternal grandmother, paternal grandfather, and paternal grandmother; Hypertension in his father. Allergies  Allergen Reactions  . 2,4-D Dimethylamine (Amisol) Hives  . Phenergan [Promethazine Hcl] Nausea And Vomiting     Review of Systems  Constitutional: Positive for fatigue. Negative for chills and fever.  HENT: Positive for facial swelling and sinus pressure. Negative for ear pain.   Respiratory: Negative for cough and shortness of breath.        Objective:   Physical Exam  Constitutional: He appears well-developed and well-nourished.  HENT:  Right Ear: External ear normal.  Left Ear: External ear normal.   Nose: Nose normal.  Mouth/Throat: Oropharynx is clear and moist. No oropharyngeal exudate.  No visible facial swelling at this time.  He states this has gone down significantly the past couple days  Cardiovascular: Normal rate and regular rhythm.  Pulmonary/Chest: Effort normal and breath sounds normal.       Assessment:     Recurrent right maxillary facial swelling.  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  He has had some recurrent recent issues with right facial swelling but really no other classic sinusitis symptoms.  Needs further evaluation No evidence for parotid swelling    Plan:     -We will set up limited maxillofacial CT of the sinuses to further evaluate. -Finish out current dose of Augmentin.  Consider extending this another week -Consider ENT referral if not seeing resolution of symptoms after finishing the Augmentin and based on CT scan results above  [name] W Aanvi Voyles MD [facility]

## 2018-05-26 ENCOUNTER — Encounter: Payer: Self-pay | Admitting: Family Medicine

## 2018-05-26 ENCOUNTER — Telehealth: Payer: Self-pay | Admitting: Family Medicine

## 2018-05-26 NOTE — Telephone Encounter (Signed)
Copied from CRM [id number]. Topic: Quick Communication - Rx Refill/Question >> May 26, 2018  4:41 PM Rica KoyanagiWeikart, Barbee CoughMelissa J wrote: Medication: abx  Has the patient contacted their pharmacy? No. (Agent: If no, request that the patient contact the pharmacy for the refill.) (Agent: If yes, when and what did the pharmacy advise?)  Preferred Pharmacy (with phone number or street name): [id number] , [place] - [address] Pt was told to call if he was not feeling better and dr would send in antibiotic.    Agent: Please be advised that RX refills may take up to 3 business days. We ask that you follow-up with your pharmacy.

## 2018-05-27 ENCOUNTER — Encounter: Payer: Self-pay | Admitting: Family Medicine

## 2018-05-27 NOTE — Telephone Encounter (Signed)
Would go ahead and send another 7 days of Augmentin 875 mg po bid - take with food.

## 2018-05-27 NOTE — Telephone Encounter (Signed)
Pt calling stating that he has been diagnosed with severe sinusitis with swelling to the right side of the face for the past couple of weeks. Pt has been seen multiple times in the office with Dr. Caryl NeverBurchette with current symptoms.Pt states he finished up a 7 day course of Augmentin that he was given from Urgent Care on last Wednesday  and feels like the sinusitis is starting to flare up again on the right side underneath cheek bone.   Pt was advised by Dr. Caryl NeverBurchette to return call to the office if he felt like symptoms were not resolving for additional course of antibiotics since he was only given a 7 day supply and not a 10 day supply. Pt [facility] [address]

## 2018-05-27 NOTE — Telephone Encounter (Signed)
Patient called, left VM to return call to the office to speak to a TN about symptoms for the antibiotic request.

## 2018-05-27 NOTE — Telephone Encounter (Signed)
Call placed to patient. Left VM to return call to office for his request for antibiotic

## 2018-05-27 NOTE — Telephone Encounter (Signed)
Please see message. °

## 2018-05-28 ENCOUNTER — Other Ambulatory Visit: Payer: Self-pay

## 2018-05-28 MED ORDER — AMOXICILLIN-POT CLAVULANATE 875-125 MG PO TABS: 1.0000 | ORAL_TABLET | Freq: Two times a day (BID) | ORAL | 0 refills | Status: AC

## 2018-05-28 NOTE — Telephone Encounter (Signed)
Called patient and LMOVM to return call  [facility] / [facility] / recommendations / Schedule patient  Called patient and left a detailed voice message to let him know that we have sent in another course of [facility] [address] and to make sure he takes with food.  CRM Created in case patient calls back.

## 2018-06-09 ENCOUNTER — Encounter: Payer: Self-pay | Admitting: Family Medicine

## 2018-06-09 ENCOUNTER — Ambulatory Visit (INDEPENDENT_AMBULATORY_CARE_PROVIDER_SITE_OTHER)
Admission: RE | Admit: 2018-06-09 | Discharge: 2018-06-09 | Disposition: A | Discharge status: Home / Self Care | Payer: BLUE CROSS/BLUE SHIELD | Source: Ambulatory Visit | Attending: Family Medicine | Admitting: Family Medicine

## 2018-06-09 DIAGNOSIS — R220 Localized swelling, mass and lump, head: Secondary | ICD-10-CM

## 2018-06-19 ENCOUNTER — Other Ambulatory Visit: Payer: Self-pay

## 2018-06-19 DIAGNOSIS — G4733 Obstructive sleep apnea (adult) (pediatric): Secondary | ICD-10-CM

## 2018-06-26 ENCOUNTER — Encounter: Payer: Self-pay | Admitting: Family Medicine

## 2018-06-27 ENCOUNTER — Other Ambulatory Visit: Payer: Self-pay

## 2018-06-27 DIAGNOSIS — R600 Localized edema: Secondary | ICD-10-CM

## 2018-06-27 DIAGNOSIS — R220 Localized swelling, mass and lump, head: Secondary | ICD-10-CM

## 2018-07-04 DIAGNOSIS — K1122 Acute recurrent sialoadenitis: Secondary | ICD-10-CM | POA: Diagnosis not present

## 2018-07-04 DIAGNOSIS — J342 Deviated nasal septum: Secondary | ICD-10-CM | POA: Diagnosis not present

## 2018-07-04 DIAGNOSIS — J310 Chronic rhinitis: Secondary | ICD-10-CM | POA: Diagnosis not present

## 2018-07-04 DIAGNOSIS — F4321 Adjustment disorder with depressed mood: Secondary | ICD-10-CM | POA: Diagnosis not present

## 2018-07-07 ENCOUNTER — Encounter: Payer: Self-pay | Admitting: Family Medicine

## 2018-07-11 ENCOUNTER — Encounter: Payer: Self-pay | Admitting: Family Medicine

## 2018-07-11 ENCOUNTER — Ambulatory Visit: Payer: BLUE CROSS/BLUE SHIELD | Admitting: Family Medicine

## 2018-07-11 ENCOUNTER — Other Ambulatory Visit: Payer: Self-pay

## 2018-07-11 VITALS — BP 112/68 | HR 92 | Temp 97.9°F | Ht 68.0 in | Wt 215.9 lb

## 2018-07-11 DIAGNOSIS — R220 Localized swelling, mass and lump, head: Secondary | ICD-10-CM | POA: Diagnosis not present

## 2018-07-11 DIAGNOSIS — R682 Dry mouth, unspecified: Secondary | ICD-10-CM

## 2018-07-11 DIAGNOSIS — R253 Fasciculation: Secondary | ICD-10-CM

## 2018-07-11 DIAGNOSIS — R5383 Other fatigue: Secondary | ICD-10-CM | POA: Diagnosis not present

## 2018-07-11 LAB — CBC WITH DIFFERENTIAL/PLATELET
BASOS PCT: 1.1 % (ref 0.0–3.0)
Basophils Absolute: 0.1 10*3/uL (ref 0.0–0.1)
Eosinophils Absolute: 0.5 10*3/uL (ref 0.0–0.7)
Eosinophils Relative: 8.6 % — ABNORMAL HIGH (ref 0.0–5.0)
HCT: 43.6 % (ref 39.0–52.0)
Hemoglobin: 14.7 g/dL (ref 13.0–17.0)
LYMPHS ABS: 1.6 10*3/uL (ref 0.7–4.0)
Lymphocytes Relative: 30.4 % (ref 12.0–46.0)
MCHC: 33.7 g/dL (ref 30.0–36.0)
MCV: 90.3 fl (ref 78.0–100.0)
MONO ABS: 0.5 10*3/uL (ref 0.1–1.0)
MONOS PCT: 8.9 % (ref 3.0–12.0)
NEUTROS PCT: 51 % (ref 43.0–77.0)
Neutro Abs: 2.7 10*3/uL (ref 1.4–7.7)
Platelets: 225 10*3/uL (ref 150.0–400.0)
RBC: 4.83 Mil/uL (ref 4.22–5.81)
RDW: 13.3 % (ref 11.5–15.5)
WBC: 5.3 10*3/uL (ref 4.0–10.5)

## 2018-07-11 LAB — VITAMIN B12: VITAMIN B 12: 618 pg/mL (ref 211–911)

## 2018-07-11 LAB — VITAMIN D 25 HYDROXY (VIT D DEFICIENCY, FRACTURES): VITD: 29.64 ng/mL — ABNORMAL LOW (ref 30.00–100.00)

## 2018-07-11 LAB — SEDIMENTATION RATE: SED RATE: 6 mm/h (ref 0–15)

## 2018-07-11 LAB — TSH: TSH: 1.13 u[IU]/mL (ref 0.35–4.50)

## 2018-07-11 NOTE — Progress Notes (Signed)
  Subjective:     Patient ID: Jeffrey [name], male   DOB: August 24, 1980, 38 y.o.   MRN: [id number]  HPI Patient has had several months of progressive fatigue.  Does have young child at home and knows that may be part of the issue.  He feels he is getting adequate sleep but still wakes up not feeling unrested.  No recent observed apnea.  He has also had some recurrent facial swelling and saw ENT and there was some concern this may be more parotid related.  He states his mother has Sjogren's syndrome and lupus.  Patient does have some dry mouth but he also takes Adderall which could be contributing to that.  His fatigue is his biggest complaint.  He has not any chest pains or dyspnea.  Appetite and weight are stable.  He is still exercising and exercise tolerance is fair.  He is a vegetarian.  Does not take any B12 supplements  He has history of recurrent eczema issues  Past Medical History:  Diagnosis Date  . Asthma    Past Surgical History:  Procedure Laterality Date  . ANTERIOR CRUCIATE LIGAMENT REPAIR Right 2010  . TONSILLECTOMY AND ADENOIDECTOMY     as a child    reports that he has never smoked. He has never used smokeless tobacco. He reports current alcohol use. He reports that he does not use drugs. family history includes Cancer in his father; Heart disease in his father, maternal grandfather, maternal grandmother, paternal grandfather, and paternal grandmother; Hypertension in his father. Allergies  Allergen Reactions  . 2,4-D Dimethylamine (Amisol) Hives  . Phenergan [Promethazine Hcl] Nausea And Vomiting     Review of Systems  Constitutional: Positive for fatigue. Negative for appetite change, chills, fever and unexpected weight change.  Respiratory: Negative for cough and shortness of breath.   Cardiovascular: Negative for chest pain.  Skin: Positive for rash.  Neurological: Negative for weakness and headaches.       Objective:   Physical Exam Constitutional:    Appearance: Normal appearance.  Neck:     Musculoskeletal: Neck supple. No neck rigidity.  Cardiovascular:     Rate and Rhythm: Normal rate and regular rhythm.  Pulmonary:     Effort: Pulmonary effort is normal.     Breath sounds: Normal breath sounds.  Musculoskeletal:        General: No swelling.     Right lower leg: No edema.     Left lower leg: No edema.  Lymphadenopathy:     Cervical: No cervical adenopathy.  Skin:    Comments: He has some dry flaky skin fairly diffusely on the face  Neurological:     Mental Status: He is alert.        Assessment:     Patient presents with history of increased fatigue.  He feels he is getting adequate sleep.  His other concerns include recurrent facial swelling, dry mouth, intermittent fasciculations involving the eye No reduction in exercise capacity.  He does not have any red flags such as weight loss, appetite change, unexplained fever, lymphadenopathy, etc.    Plan:     -Check labs with TSH, CBC, sed rate, antinuclear antibody, Sjogren's antibodies, vitamin D and vitamin B12 levels -He is encouraged to try to get in here for follow-up at the time of any recurrent facial swelling  Kristian Covey MD [facility] at [facility]

## 2018-07-13 ENCOUNTER — Encounter: Payer: Self-pay | Admitting: Family Medicine

## 2018-07-14 ENCOUNTER — Encounter: Payer: Self-pay | Admitting: Family Medicine

## 2018-07-14 LAB — SJOGREN'S SYNDROME ANTIBODS(SSA + SSB)
SSA (RO) (ENA) ANTIBODY, IGG: NEGATIVE AI
SSB (La) (ENA) Antibody, IgG: <1 AI

## 2018-07-14 LAB — ANA: Anti Nuclear Antibody(ANA): NEGATIVE

## 2018-07-16 ENCOUNTER — Other Ambulatory Visit: Payer: Self-pay | Admitting: Family Medicine

## 2018-07-16 NOTE — Telephone Encounter (Signed)
Last OV 07/11/18, No future OV  Last filled 04/22/18, # 60 with 2 refills

## 2018-07-17 MED ORDER — AMPHETAMINE-DEXTROAMPHETAMINE 20 MG PO TABS: 20.0000 mg | ORAL_TABLET | Freq: Two times a day (BID) | ORAL | 0 refills | Status: DC

## 2018-08-01 DIAGNOSIS — F4321 Adjustment disorder with depressed mood: Secondary | ICD-10-CM | POA: Diagnosis not present

## 2018-08-25 ENCOUNTER — Ambulatory Visit: Payer: BLUE CROSS/BLUE SHIELD | Admitting: Family Medicine

## 2018-08-25 ENCOUNTER — Encounter: Payer: Self-pay | Admitting: Family Medicine

## 2018-08-27 ENCOUNTER — Other Ambulatory Visit: Payer: Self-pay | Admitting: Family Medicine

## 2018-08-27 NOTE — Telephone Encounter (Signed)
Last OV 07/11/18, No future OV  Last filled 07/17/18, # 60 with 2 refills

## 2018-08-28 NOTE — Telephone Encounter (Signed)
Should still have refills at pharmacy.

## 2018-08-29 DIAGNOSIS — F4321 Adjustment disorder with depressed mood: Secondary | ICD-10-CM | POA: Diagnosis not present

## 2018-09-03 ENCOUNTER — Telehealth: Payer: BLUE CROSS/BLUE SHIELD | Admitting: Nurse Practitioner

## 2018-09-03 DIAGNOSIS — R6889 Other general symptoms and signs: Secondary | ICD-10-CM

## 2018-09-03 MED ORDER — OSELTAMIVIR PHOSPHATE 75 MG PO CAPS: 75.0000 mg | ORAL_CAPSULE | Freq: Two times a day (BID) | ORAL | 0 refills | Status: DC

## 2018-09-03 NOTE — Progress Notes (Signed)
E visit for Flu like symptoms   We are sorry that you are not feeling well.  Here is how we plan to help! Based on what you have shared with me it looks like you may have a respiratory virus that may be influenza. * spoke with patient on phone. His sob is actually from his asthma and he denies having any exposure to anyone that may have corona virus. He says he feels the same as he did when he had Flu a 2 months ago.  Influenza or "the flu" is   an infection caused by a respiratory virus. The flu virus is highly contagious and persons who did not receive their yearly flu vaccination may "catch" the flu from close contact.  We have anti-viral medications to treat the viruses that cause this infection. They are not a "cure" and only shorten the course of the infection. These prescriptions are most effective when they are given within the first 2 days of "flu" symptoms. Antiviral medication are indicated if you have a high risk of complications from the flu. You should  also consider an antiviral medication if you are in close contact with someone who is at risk. These medications can help patients avoid complications from the flu  but have side effects that you should know. Possible side effects from Tamiflu or oseltamivir include nausea, vomiting, diarrhea, dizziness, headaches, eye redness, sleep problems or other respiratory symptoms. You should not take Tamiflu if you have an allergy to oseltamivir or any to the ingredients in Tamiflu.  Based upon your symptoms and potential risk factors I have prescribed Oseltamivir (Tamiflu).  It has been sent to your designated pharmacy.  You will take one 75 mg capsule orally twice a day for the next 5 days.  ANYONE WHO HAS FLU SYMPTOMS SHOULD: . Stay home. The flu is highly contagious and going out or to work exposes others! . Be sure to drink plenty of fluids. Water is fine as well as fruit juices, sodas and electrolyte beverages. You may want to stay away  from caffeine or alcohol. If you are nauseated, try taking small sips of liquids. How do you know if you are getting enough fluid? Your urine should be a pale yellow or almost colorless. . Get rest. . Taking a steamy shower or using a humidifier may help nasal congestion and ease sore throat pain. Using a saline nasal spray works much the same way. . Cough drops, hard candies and sore throat lozenges may ease your cough. . Line up a caregiver. Have someone check on you regularly.   GET HELP RIGHT AWAY IF: . You cannot keep down liquids or your medications. . You become short of breath . Your fell like you are going to pass out or loose consciousness. . Your symptoms persist after you have completed your treatment plan MAKE SURE YOU   Understand these instructions.  Will watch your condition.  Will get help right away if you are not doing well or get worse.  Your e-visit answers were reviewed by a board certified advanced clinical practitioner to complete your personal care plan.  Depending on the condition, your plan could have included both over the counter or prescription medications.  If there is a problem please reply  once you have received a response from your provider.  Your safety is important to [place].  If you have drug allergies check your prescription carefully.    You can use MyChart to ask questions about today's  visit, request a non-urgent call back, or ask for a work or school excuse for 24 hours related to this e-Visit. If it has been greater than 24 hours you will need to follow up with your provider, or enter a new e-Visit to address those concerns.  You will get an e-mail in the next two days asking about your experience.  I hope that your e-visit has been valuable and will speed your recovery. Thank you for using e-visits.  5 minutes spent reviewing and documenting in chart.

## 2018-09-04 ENCOUNTER — Ambulatory Visit: Payer: BLUE CROSS/BLUE SHIELD | Admitting: Internal Medicine

## 2018-09-04 ENCOUNTER — Telehealth: Payer: Self-pay | Admitting: Emergency Medicine

## 2018-09-04 ENCOUNTER — Telehealth: Payer: Self-pay

## 2018-09-04 ENCOUNTER — Other Ambulatory Visit: Payer: Self-pay | Admitting: Internal Medicine

## 2018-09-04 DIAGNOSIS — R6889 Other general symptoms and signs: Secondary | ICD-10-CM

## 2018-09-04 NOTE — Telephone Encounter (Signed)
Questions for Screening COVID-19  Symptom onset:  Started x 2 days ago -E-Visit last night - given Tamiflu - pt did not feel comfortable with diagnosis over the phone.  Dx with Flu A? In October 2019 Pt has Asthma Chills Body aches Feverish - fever 101.5-102 Scratchy throat Productive cough with yellow - grey mucous, little blood x 2-3 days  Prone to Bronchitis IMMUNOSUPPRESSED    No runny nose, SOB  Travel or Contacts: No travel [place] - 3 clients have traveled outside of the [place] -- [place], [place] and possibly [place] in the last 2 weeks.   During this illness, did/does the patient experience any of the following symptoms? Fever >100.43F [x]   Yes []   No []   Unknown Subjective fever (felt feverish) [x]   Yes []   No []   Unknown Chills [x]   Yes []   No []   Unknown Muscle aches (myalgia) [x]   Yes []   No []   Unknown Runny nose (rhinorrhea) []   Yes [x]   No []   Unknown Sore throat [x]   Yes []   No []   Unknown scratchy throat Cough (new onset or worsening of chronic cough) [x]   Yes []   No []   Unknown Shortness of breath (dyspnea) []   Yes [x]   No []   Unknown Nausea or vomiting []   Yes [x]   No []   Unknown Headache [x]   Yes []   No []   Unknown Abdominal pain  []   Yes [x]   No []   Unknown Diarrhea (?3 loose/looser than normal stools/24hr period) []   Yes [x]   No []   Unknown Other, specify:  Patient risk factors: Smoker? []   Current []   Former []   Never If male, currently pregnant? []   Yes []   No  Patient Active Problem List   Diagnosis Date Noted  . Obesity (BMI 30-39.9) 07/16/2014  . ADD (attention deficit disorder) 11/14/2012  . 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 Asthma, mild intermittent 10/01/2012    Plan:  [x]   High risk for COVID-19 with red flags go to ED (with CP, SOB, weak/lightheaded, or fever > 101.5). Call ahead.  [x]   High risk for COVID-19 but stable will have car visit. Inform provider and coordinate time. Will be completed in afternoon. []   No red flags but  URI signs or symptoms will go through side door and be seen in dedicated room.  Note: Referral to telemedicine is an appropriate alternative disposition for higher risk but stable. [facility]/e-Visit: [phone].

## 2018-09-04 NOTE — Telephone Encounter (Signed)
Instructed per Dr Ardyth Harps to go to the local Covid Testing site.  Pt given instructions and directions.   Dr Ardyth Harps to place the order for the test.   appt cancelled for today.  Will route to her.

## 2018-09-07 ENCOUNTER — Encounter: Payer: Self-pay | Admitting: Family Medicine

## 2018-09-09 LAB — NOVEL CORONAVIRUS, NAA: SARS-CoV-2, NAA: NOT DETECTED

## 2018-10-10 DIAGNOSIS — F4321 Adjustment disorder with depressed mood: Secondary | ICD-10-CM | POA: Diagnosis not present

## 2018-10-13 ENCOUNTER — Other Ambulatory Visit: Payer: Self-pay | Admitting: Family Medicine

## 2018-10-13 MED ORDER — AMPHETAMINE-DEXTROAMPHETAMINE 20 MG PO TABS
20.0000 mg | ORAL_TABLET | Freq: Two times a day (BID) | ORAL | 0 refills | Status: DC
Start: 2018-10-13 — End: 2018-11-20

## 2018-10-13 MED ORDER — AMPHETAMINE-DEXTROAMPHETAMINE 20 MG PO TABS: 20.0000 mg | ORAL_TABLET | Freq: Two times a day (BID) | ORAL | 0 refills | Status: DC

## 2018-10-13 NOTE — Telephone Encounter (Signed)
Last OV 07/11/18, No future OV  Last filled 07/17/18, # 60 with 2 refills

## 2018-11-07 DIAGNOSIS — F4321 Adjustment disorder with depressed mood: Secondary | ICD-10-CM | POA: Diagnosis not present

## 2018-11-20 ENCOUNTER — Other Ambulatory Visit: Payer: Self-pay

## 2018-11-20 ENCOUNTER — Telehealth: Payer: Self-pay

## 2018-11-20 ENCOUNTER — Ambulatory Visit (INDEPENDENT_AMBULATORY_CARE_PROVIDER_SITE_OTHER): Payer: BC Managed Care – PPO | Admitting: Family Medicine

## 2018-11-20 DIAGNOSIS — R030 Elevated blood-pressure reading, without diagnosis of hypertension: Secondary | ICD-10-CM

## 2018-11-20 DIAGNOSIS — F988 Other specified behavioral and emotional disorders with onset usually occurring in childhood and adolescence: Secondary | ICD-10-CM

## 2018-11-20 MED ORDER — VALSARTAN 80 MG PO TABS: 80.0000 mg | ORAL_TABLET | Freq: Every day | ORAL | 5 refills | Status: DC

## 2018-11-20 MED ORDER — LISDEXAMFETAMINE DIMESYLATE 40 MG PO CAPS: 40.0000 mg | ORAL_CAPSULE | ORAL | 0 refills | Status: DC

## 2018-11-20 NOTE — Progress Notes (Signed)
Patient ID: Jeffrey [name], male   DOB: Nov 12, 1980, 38 y.o.   MRN: [id number]  This visit type was conducted due to national recommendations for restrictions regarding the COVID-19 pandemic in an effort to limit this patient's exposure and mitigate transmission in our community.   Virtual Visit via Video Note  I connected with Jeffrey [name] on 11/20/18 at 10:15 AM EDT by a video enabled telemedicine application and verified that I am speaking with the correct person using two identifiers.  Location patient: home Location provider:work or home office Persons participating in the virtual visit: patient, provider  I discussed the limitations of evaluation and management by telemedicine and the availability of in person appointments. The patient expressed understanding and agreed to proceed.   HPI: Patient called to discuss the following items  Multiple recent elevated blood pressures.  He has consistently had good readings here but has had readings as high as 185 systolic and 90s diastolic over the past several days.  He states his blood pressures been 160s systolic today.  He is taken this multiple times.  His wife is checked hers concomitantly and is gotten good readings.  He thinks some of this may be stress related.  He has had stress with work and also a new child.  He has been running 4 to 6 miles per day but over the past couple weeks has been somewhat more difficult.  No chest pains.  No peripheral edema.  Has had occasional palpitations which he thinks may be related to the Adderall .  He states that both parents have hypertension.  He has not had any recent nonsteroidal or Sudafed use.  Rare alcohol.  He has attention deficit disorder and takes Adderall.  He would like to look at more extended release type options if possible such as Vyvanse.   ROS: See pertinent positives and negatives per HPI.  Past Medical History:  Diagnosis Date  . Asthma     Past Surgical History:   Procedure Laterality Date  . ANTERIOR CRUCIATE LIGAMENT REPAIR Right 2010  . TONSILLECTOMY AND ADENOIDECTOMY     as a child    Family History  Problem Relation Age of Onset  . Cancer Father        prostate  . Heart disease Father   . Hypertension Father   . Heart disease Maternal Grandmother   . Heart disease Maternal Grandfather   . Heart disease Paternal Grandmother   . Heart disease Paternal Grandfather     SOCIAL HX: Non-smoker.  Married with 1 child   Current Outpatient Medications:  .  albuterol (VENTOLIN HFA) 108 (90 Base) MCG/ACT inhaler, Inhale 1-2 puffs into the lungs every 6 (six) hours as needed for wheezing or shortness of breath., Disp: 1 Inhaler, Rfl: 2 .  amphetamine-dextroamphetamine (ADDERALL) 20 MG tablet, Take 1 tablet (20 mg total) by mouth 2 (two) times daily. May refill in two months., Disp: 60 tablet, Rfl: 0 .  amphetamine-dextroamphetamine (ADDERALL) 20 MG tablet, Take 1 tablet (20 mg total) by mouth 2 (two) times daily., Disp: 60 tablet, Rfl: 0 .  amphetamine-dextroamphetamine (ADDERALL) 20 MG tablet, Take 1 tablet (20 mg total) by mouth 2 (two) times daily. May refill in one month., Disp: 60 tablet, Rfl: 0 .  augmented betamethasone dipropionate (DIPROLENE-AF) 0.05 % ointment, Apply topically 2 (two) times daily., Disp: 15 g, Rfl: 1 .  cyclobenzaprine (FLEXERIL) 10 MG tablet, Take 1 tablet (10 mg total) by mouth 3 (three) times daily as needed for muscle  spasms., Disp: 30 tablet, Rfl: 0 .  Desoximetasone (TOPICORT) 0.25 % ointment, Apply 1 application topically 2 (two) times daily as needed., Disp: 30 g, Rfl: 2 .  LORazepam (ATIVAN) 0.5 MG tablet, Take 1 tablet (0.5 mg total) by mouth every 8 (eight) hours as needed for anxiety., Disp: 20 tablet, Rfl: 0 .  Multiple Vitamins-Minerals (MENS MULTI VITAMIN & MINERAL PO), Take by mouth daily., Disp: , Rfl:  .  naproxen (NAPROSYN) 375 MG tablet, Take 1 tablet (375 mg total) by mouth 2 (two) times daily., Disp: 20  tablet, Rfl: 0 .  naproxen sodium (ANAPROX) 220 MG tablet, Take 220 mg by mouth 2 (two) times daily with a meal., Disp: , Rfl:  .  oseltamivir (TAMIFLU) 75 MG capsule, Take 1 capsule (75 mg total) by mouth 2 (two) times daily., Disp: 10 capsule, Rfl: 0 .  tamsulosin (FLOMAX) 0.4 MG CAPS capsule, Take 1 capsule (0.4 mg total) by mouth daily., Disp: 20 capsule, Rfl: 0 .  triamcinolone cream (KENALOG) 0.1 %, Apply 1 application topically 2 (two) times daily as needed., Disp: 453 g, Rfl: 1  EXAM:  VITALS per patient if applicable:  GENERAL: alert, oriented, appears well and in no acute distress  HEENT: atraumatic, conjunttiva clear, no obvious abnormalities on inspection of external nose and ears  NECK: normal movements of the head and neck  LUNGS: on inspection no signs of respiratory distress, breathing rate appears normal, no obvious gross SOB, gasping or wheezing  CV: no obvious cyanosis  MS: moves all visible extremities without noticeable abnormality  PSYCH/NEURO: pleasant and cooperative, no obvious depression or anxiety, speech and thought processing grossly intact  ASSESSMENT AND PLAN:  Discussed the following assessment and plan:  #1 elevated blood pressure.  Has had multiple elevated readings confirmed over multiple days.  -Discussed nonpharmacologic factors that can affect blood pressure -Continue regular aerobic activity as tolerated -Start valsartan 80 mg once daily and in office follow-up next Wednesday to reassess  #2 attention deficit disorder.  Patient has concerns regarding Adderall efficacy and potential side effects.  Would like to look at changing to extended release type preparation  -Trial of Vyvanse 40 mg once daily and will be in office next week for follow-up and feedback then    I discussed the assessment and treatment plan with the patient. The patient was provided an opportunity to ask questions and all were answered. The patient agreed with the plan  and demonstrated an understanding of the instructions.   The patient was advised to call back or seek an in-person evaluation if the symptoms worsen or if the condition fails to improve as anticipated.   Evelena PeatBruce Aayushi Solorzano, MD

## 2018-11-20 NOTE — Telephone Encounter (Signed)
lvm for pt to call [phone] for when he comes to his appt next Wednesday at 8am  For in office visit

## 2018-11-26 ENCOUNTER — Ambulatory Visit: Payer: BC Managed Care – PPO | Admitting: Family Medicine

## 2018-11-26 ENCOUNTER — Other Ambulatory Visit: Payer: Self-pay

## 2018-11-26 ENCOUNTER — Encounter: Payer: Self-pay | Admitting: Family Medicine

## 2018-11-26 VITALS — BP 120/70 | HR 75 | Temp 98.4°F | Wt 211.9 lb

## 2018-11-26 DIAGNOSIS — I10 Essential (primary) hypertension: Secondary | ICD-10-CM

## 2018-11-26 DIAGNOSIS — L309 Dermatitis, unspecified: Secondary | ICD-10-CM | POA: Diagnosis not present

## 2018-11-26 DIAGNOSIS — F988 Other specified behavioral and emotional disorders with onset usually occurring in childhood and adolescence: Secondary | ICD-10-CM

## 2018-11-26 MED ORDER — ALBUTEROL SULFATE HFA 108 (90 BASE) MCG/ACT IN AERS: 1.0000 | INHALATION_SPRAY | Freq: Four times a day (QID) | RESPIRATORY_TRACT | 2 refills | Status: DC | PRN

## 2018-11-26 NOTE — Progress Notes (Signed)
  Subjective:     Patient ID: Jeffrey [name], male   DOB: [birth year], 38 y.o.   MRN: [id number]  HPI   Patient is here for follow-up regarding hypertension and attention deficit disorder.  Refer to previous note.  He had had several elevated readings at home for some time.  We started low-dose losartan.  He is tolerated well except for some very mild dizziness couple of occasions.  That seems to be improving.  Blood pressure much improved and now in normal range.  Denies any other side effects.  We switched him from Adderall immediate release to Vyvanse and he thinks this is working much better.  He feels less "hyped up ".  He feels this is a smoother release which is reducing side effects.  Recurrent eczema involving his feet and legs.  He has some topical steroids which do help somewhat.  Past Medical History:  Diagnosis Date  . Asthma    Past Surgical History:  Procedure Laterality Date  . ANTERIOR CRUCIATE LIGAMENT REPAIR Right 2010  . TONSILLECTOMY AND ADENOIDECTOMY     as a child    reports that he has never smoked. He has never used smokeless tobacco. He reports current alcohol use. He reports that he does not use drugs. family history includes Cancer in his father; Heart disease in his father, maternal grandfather, maternal grandmother, paternal grandfather, and paternal grandmother; Hypertension in his father. Allergies  Allergen Reactions  . 2,4-D Dimethylamine (Amisol) Hives  . Phenergan [Promethazine Hcl] Nausea And Vomiting     Review of Systems  Constitutional: Negative for fatigue.  Eyes: Negative for visual disturbance.  Respiratory: Negative for cough, chest tightness and shortness of breath.   Cardiovascular: Negative for chest pain, palpitations and leg swelling.  Neurological: Negative for dizziness, syncope, weakness, light-headedness and headaches.       Objective:   Physical Exam Constitutional:      Appearance: He is well-developed.  HENT:   Right Ear: External ear normal.     Left Ear: External ear normal.  Eyes:     Pupils: Pupils are equal, round, and reactive to light.  Neck:     Musculoskeletal: Neck supple.     Thyroid: No thyromegaly.  Cardiovascular:     Rate and Rhythm: Normal rate and regular rhythm.  Pulmonary:     Effort: Pulmonary effort is normal. No respiratory distress.     Breath sounds: Normal breath sounds. No wheezing or rales.  Skin:    Findings: Rash present.     Comments: He has slightly erythematous scaly rash right lateral leg consistent with eczema  Neurological:     Mental Status: He is alert and oriented to person, place, and time.        Assessment:     #1 hypertension improved with low-dose valsartan.  #2 attention deficit disorder stable with Vyvanse.  He is having fewer side effects than he did with Adderall and is very pleased with this medication thus far  #3 recurrent eczema lower extremities    Plan:     -Continue regular exercise habits.  Continue valsartan 80 mg daily -Continue Vyvanse 40 mg daily.  He will call back for refills on refill for 3 months when this is due -Continue topical steroid as needed for eczema flares  [name] MD [facility]

## 2018-12-05 DIAGNOSIS — F4321 Adjustment disorder with depressed mood: Secondary | ICD-10-CM | POA: Diagnosis not present

## 2018-12-08 ENCOUNTER — Telehealth: Payer: Self-pay

## 2018-12-08 DIAGNOSIS — Z20828 Contact with and (suspected) exposure to other viral communicable diseases: Secondary | ICD-10-CM | POA: Diagnosis not present

## 2018-12-08 NOTE — Telephone Encounter (Signed)
Please see message.  Copied from [place] [phone]. 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

## 2018-12-08 NOTE — Telephone Encounter (Signed)
noted 

## 2018-12-24 ENCOUNTER — Other Ambulatory Visit: Payer: Self-pay | Admitting: Family Medicine

## 2018-12-24 MED ORDER — LISDEXAMFETAMINE DIMESYLATE 40 MG PO CAPS
40.0000 mg | ORAL_CAPSULE | ORAL | 0 refills | Status: DC
Start: 2018-12-24 — End: 2019-04-06

## 2018-12-24 MED ORDER — LISDEXAMFETAMINE DIMESYLATE 40 MG PO CAPS: ORAL_CAPSULE | ORAL | 0 refills | Status: DC

## 2019-01-01 ENCOUNTER — Encounter: Payer: Self-pay | Admitting: Family Medicine

## 2019-01-09 ENCOUNTER — Other Ambulatory Visit: Payer: Self-pay

## 2019-01-09 ENCOUNTER — Ambulatory Visit (INDEPENDENT_AMBULATORY_CARE_PROVIDER_SITE_OTHER): Payer: BC Managed Care – PPO | Admitting: Family Medicine

## 2019-01-09 ENCOUNTER — Encounter: Payer: Self-pay | Admitting: Family Medicine

## 2019-01-09 DIAGNOSIS — F988 Other specified behavioral and emotional disorders with onset usually occurring in childhood and adolescence: Secondary | ICD-10-CM | POA: Diagnosis not present

## 2019-01-09 DIAGNOSIS — I10 Essential (primary) hypertension: Secondary | ICD-10-CM

## 2019-01-09 DIAGNOSIS — F4321 Adjustment disorder with depressed mood: Secondary | ICD-10-CM | POA: Diagnosis not present

## 2019-01-09 DIAGNOSIS — J4520 Mild intermittent asthma, uncomplicated: Secondary | ICD-10-CM | POA: Diagnosis not present

## 2019-01-09 DIAGNOSIS — R6889 Other general symptoms and signs: Secondary | ICD-10-CM

## 2019-01-09 NOTE — Progress Notes (Signed)
Patient ID: Jeffrey [name], male   DOB: [birth year], 38 y.o.   MRN: [id number]   This visit type was conducted due to national recommendations for restrictions regarding the COVID-19 pandemic in an effort to limit this patient's exposure and mitigate transmission in our community.   Virtual Visit via Video Note  I connected with Jeffrey [name] on 01/09/19 at 11:45 AM EDT by a video enabled telemedicine application and verified that I am speaking with the correct person using two identifiers.  Location patient: home Location provider:work or home office Persons participating in the virtual visit: patient, provider  I discussed the limitations of evaluation and management by telemedicine and the availability of in person appointments. The patient expressed understanding and agreed to proceed.   HPI: Patient has history of attention deficit disorder, IgA deficiency, mild intermittent asthma, hypertension.  He is lost substantial amount of weight due to his efforts over the past several years.  He started running some and had recently noted significant increase in running times for the same distance.  He realizes some of this is due to the humidity and heat but he felt perhaps more than should be otherwise.  He had questions initially where this could be related to his Vyvanse or valsartan.  He was recently started on valsartan for hypertension and actually feels much better overall.  Never had a chest pains.  He had stress test couple years ago that was unremarkable.  He is recently gone to rowing machine indoors and has been working out up to 45 minutes continuously with that with no difficulty.  He has been trying to hydrate well.  Sometimes does not eat a lot during the day secondary to the Vyvanse.  Family history significant for father dying of MI at age 36.  His mother also apparently has some coronary disease later in life.  Patient has no history of diabetes.  Non-smoker.  Blood pressure  recently controlled.  Recent TSH normal   ROS: See pertinent positives and negatives per HPI.  Past Medical History:  Diagnosis Date  . Asthma     Past Surgical History:  Procedure Laterality Date  . ANTERIOR CRUCIATE LIGAMENT REPAIR Right 2010  . TONSILLECTOMY AND ADENOIDECTOMY     as a child    Family History  Problem Relation Age of Onset  . Cancer Father        prostate  . Heart disease Father   . Hypertension Father   . Heart disease Maternal Grandmother   . Heart disease Maternal Grandfather   . Heart disease Paternal Grandmother   . Heart disease Paternal Grandfather     SOCIAL HX: Non-smoker.  Married with 1 child   Current Outpatient Medications:  .  albuterol (VENTOLIN HFA) 108 (90 Base) MCG/ACT inhaler, Inhale 1-2 puffs into the lungs every 6 (six) hours as needed for wheezing or shortness of breath., Disp: 1 Inhaler, Rfl: 2 .  augmented betamethasone dipropionate (DIPROLENE-AF) 0.05 % ointment, Apply topically 2 (two) times daily., Disp: 15 g, Rfl: 1 .  cyclobenzaprine (FLEXERIL) 10 MG tablet, Take 1 tablet (10 mg total) by mouth 3 (three) times daily as needed for muscle spasms. (Patient not taking: Reported on 11/26/2018), Disp: 30 tablet, Rfl: 0 .  Desoximetasone (TOPICORT) 0.25 % ointment, Apply 1 application topically 2 (two) times daily as needed., Disp: 30 g, Rfl: 2 .  lisdexamfetamine (VYVANSE) 40 MG capsule, Take 1 capsule (40 mg total) by mouth every morning., Disp: 30 capsule, Rfl: 0 .  lisdexamfetamine (VYVANSE) 40 MG capsule, Take one capsule by mouth every morning.  May refill in one month, Disp: 30 capsule, Rfl: 0 .  lisdexamfetamine (VYVANSE) 40 MG capsule, Take one capsule by mouth every morning.  May refill in two months., Disp: 30 capsule, Rfl: 0 .  LORazepam (ATIVAN) 0.5 MG tablet, Take 1 tablet (0.5 mg total) by mouth every 8 (eight) hours as needed for anxiety., Disp: 20 tablet, Rfl: 0 .  Multiple Vitamins-Minerals (MENS MULTI VITAMIN &  MINERAL PO), Take by mouth daily., Disp: , Rfl:  .  naproxen (NAPROSYN) 375 MG tablet, Take 1 tablet (375 mg total) by mouth 2 (two) times daily., Disp: 20 tablet, Rfl: 0 .  naproxen sodium (ANAPROX) 220 MG tablet, Take 220 mg by mouth 2 (two) times daily with a meal., Disp: , Rfl:  .  oseltamivir (TAMIFLU) 75 MG capsule, Take 1 capsule (75 mg total) by mouth 2 (two) times daily. (Patient not taking: Reported on 11/26/2018), Disp: 10 capsule, Rfl: 0 .  tamsulosin (FLOMAX) 0.4 MG CAPS capsule, Take 1 capsule (0.4 mg total) by mouth daily. (Patient not taking: Reported on 11/26/2018), Disp: 20 capsule, Rfl: 0 .  triamcinolone cream (KENALOG) 0.1 %, Apply 1 application topically 2 (two) times daily as needed., Disp: 453 g, Rfl: 1 .  valsartan (DIOVAN) 80 MG tablet, Take 1 tablet (80 mg total) by mouth daily., Disp: 30 tablet, Rfl: 5  EXAM:  VITALS per patient if applicable:  GENERAL: alert, oriented, appears well and in no acute distress  HEENT: atraumatic, conjunttiva clear, no obvious abnormalities on inspection of external nose and ears  NECK: normal movements of the head and neck  LUNGS: on inspection no signs of respiratory distress, breathing rate appears normal, no obvious gross SOB, gasping or wheezing  CV: no obvious cyanosis  MS: moves all visible extremities without noticeable abnormality  PSYCH/NEURO: pleasant and cooperative, no obvious depression or anxiety, speech and thought processing grossly intact  ASSESSMENT AND PLAN:  Discussed the following assessment and plan:  #1 hypertension.  Recent initiation of valsartan.  Blood pressure much improved  #2 decreased exercise tolerance.  Suspect this is largely related to the weather.  He has not had any chest pain whatsoever and no problems with exercise tolerance indoors -We recommended observing for now. -Did have exercise tolerance test within the past 2 years as above.  We discussed possible cardiopulmonary test if symptoms  persist or worsen  #3 attention deficit disorder improved on Vyvanse    I discussed the assessment and treatment plan with the patient. The patient was provided an opportunity to ask questions and all were answered. The patient agreed with the plan and demonstrated an understanding of the instructions.   The patient was advised to call back or seek an in-person evaluation if the symptoms worsen or if the condition fails to improve as anticipated.   Carolann Littler, MD

## 2019-01-28 ENCOUNTER — Other Ambulatory Visit: Payer: Self-pay | Admitting: Family Medicine

## 2019-01-28 NOTE — Telephone Encounter (Signed)
Called patient and left a detailed voice message to let him know that he does have 2 refills on file with the pharmacy for his Vyvanse Rx and I see that they have been sent by email confirmation.  OK for PEC to discuss and/or advise.  CRM Created.

## 2019-01-28 NOTE — Telephone Encounter (Signed)
Last OV 01/09/19, No future OV  Last filled 12/24/18, # 30 with 2 refills

## 2019-01-28 NOTE — Telephone Encounter (Signed)
This was sent to pharmacy for 3 months back in July so pharmacy should have electronic record for 2 more refills.

## 2019-01-30 ENCOUNTER — Other Ambulatory Visit: Payer: Self-pay | Admitting: Family Medicine

## 2019-02-02 NOTE — Telephone Encounter (Signed)
Pt called in to ask if someone could call the pharmacy to discuss. Pt says that his pharmacy is giving him a hard time about releasing his medication to him.   Advised pt of the message below. Pt would like further assistance with getting his med.

## 2019-02-03 NOTE — Telephone Encounter (Signed)
Called [facility] on [address] and spoke to a male pharmacist and he was able to refill this prescription since he had 2 on file.   I called patient and left a voice message that this should be ready for pick up sometime today.

## 2019-02-05 ENCOUNTER — Encounter: Payer: Self-pay | Admitting: Family Medicine

## 2019-02-06 ENCOUNTER — Telehealth: Payer: Self-pay

## 2019-02-06 DIAGNOSIS — F4321 Adjustment disorder with depressed mood: Secondary | ICD-10-CM | POA: Diagnosis not present

## 2019-02-06 NOTE — Telephone Encounter (Signed)
Called patient to set up a [facility] virtual visit for 30 mins per Dr. Elease Hashimoto about his MyChart message.  OK for PEC to discuss/advise//and/or make an appointment.  CRM Created.

## 2019-02-09 ENCOUNTER — Other Ambulatory Visit: Payer: Self-pay

## 2019-02-09 ENCOUNTER — Ambulatory Visit (INDEPENDENT_AMBULATORY_CARE_PROVIDER_SITE_OTHER): Payer: BC Managed Care – PPO | Admitting: Family Medicine

## 2019-02-09 DIAGNOSIS — F329 Major depressive disorder, single episode, unspecified: Secondary | ICD-10-CM | POA: Diagnosis not present

## 2019-02-09 DIAGNOSIS — G479 Sleep disorder, unspecified: Secondary | ICD-10-CM | POA: Diagnosis not present

## 2019-02-09 DIAGNOSIS — F32A Depression, unspecified: Secondary | ICD-10-CM

## 2019-02-09 DIAGNOSIS — R1012 Left upper quadrant pain: Secondary | ICD-10-CM

## 2019-02-09 MED ORDER — ESCITALOPRAM OXALATE 10 MG PO TABS: 10.0000 mg | ORAL_TABLET | Freq: Every day | ORAL | 5 refills | Status: DC

## 2019-02-09 NOTE — Progress Notes (Signed)
Patient ID: Jeffrey [name], male   DOB: 30-Apr-1981, 38 y.o.   MRN: [id number]  This visit type was conducted due to national recommendations for restrictions regarding the COVID-19 pandemic in an effort to limit this patient's exposure and mitigate transmission in our community.   Virtual Visit via Video Note  I connected with [name] on 02/09/19 at 11:30 AM EDT by a video enabled telemedicine application and verified that I am speaking with the correct person using two identifiers.  Location patient: home Location provider:work or home office Persons participating in the virtual visit: patient, provider  I discussed the limitations of evaluation and management by telemedicine and the availability of in person appointments. The patient expressed understanding and agreed to proceed.   HPI: Patient had sent a my chart message last week with several concerns including some upper abdominal pain mostly left-sided, concerns for possible sleep apnea, and concerns for depression and anxiety symptoms.  Given multiple complaints we recommended follow-up today to discuss these in more detail.  Abdominal pain started about 7 to 10 days ago.  Location is left upper abdomen.  Symptoms are intermittent.  By Friday they were getting worse.  He describes the pain as dull and near the bottom of his left lower rib cage area.  No clear exacerbating factors.  He tried some Tums without much improvement.  He describes some burning discomfort.  No fever.  No skin rash.  No nausea or vomiting.  Fair appetite.  No nonsteroidal use.  No melena.  No history of peptic ulcer disease.  He has a vegetarian diet and has had some recent gas and bloating.  Recent increased stress.  Drinks 1 cup of coffee per day.  Frequent spicy foods.  Concerns for sleep disturbance.  He states this has been chronic really for several years.  He had concerns about obstructive apnea but has lost tremendous amount of weight due to his efforts  past few years and wife has not recently observed any apnea episodes.  No frequent daytime somnolence though he does have daytime fatigue.  He thinks a lot of this is probably stress related  Finally, patient has concerns about possible generalized anxiety symptoms and depression.  He is seeing a therapist regularly.  Increased stress situations with work and home issues.  Recently had to put down a dog and also multiple repairs at home.  No suicidal ideation.  Not treated for depression previously   ROS: See pertinent positives and negatives per HPI.  Past Medical History:  Diagnosis Date  . Asthma     Past Surgical History:  Procedure Laterality Date  . ANTERIOR CRUCIATE LIGAMENT REPAIR Right 2010  . TONSILLECTOMY AND ADENOIDECTOMY     as a child    Family History  Problem Relation Age of Onset  . Cancer Father        prostate  . Heart disease Father   . Hypertension Father   . Heart disease Maternal Grandmother   . Heart disease Maternal Grandfather   . Heart disease Paternal Grandmother   . Heart disease Paternal Grandfather     SOCIAL HX: Married with 1 child.  Non-smoker.  No regular alcohol use.  Works for a [employer]   Current Outpatient Medications:  .  albuterol (VENTOLIN HFA) 108 (90 Base) MCG/ACT inhaler, Inhale 1-2 puffs into the lungs every 6 (six) hours as needed for wheezing or shortness of breath., Disp: 1 Inhaler, Rfl: 2 .  augmented betamethasone dipropionate (DIPROLENE-AF) 0.05 %  ointment, Apply topically 2 (two) times daily., Disp: 15 g, Rfl: 1 .  cyclobenzaprine (FLEXERIL) 10 MG tablet, Take 1 tablet (10 mg total) by mouth 3 (three) times daily as needed for muscle spasms. (Patient not taking: Reported on 11/26/2018), Disp: 30 tablet, Rfl: 0 .  Desoximetasone (TOPICORT) 0.25 % ointment, Apply 1 application topically 2 (two) times daily as needed., Disp: 30 g, Rfl: 2 .  escitalopram (LEXAPRO) 10 MG tablet, Take 1 tablet (10 mg total) by mouth daily., Disp: 30  tablet, Rfl: 5 .  lisdexamfetamine (VYVANSE) 40 MG capsule, Take 1 capsule (40 mg total) by mouth every morning., Disp: 30 capsule, Rfl: 0 .  lisdexamfetamine (VYVANSE) 40 MG capsule, Take one capsule by mouth every morning.  May refill in one month, Disp: 30 capsule, Rfl: 0 .  lisdexamfetamine (VYVANSE) 40 MG capsule, Take one capsule by mouth every morning.  May refill in two months., Disp: 30 capsule, Rfl: 0 .  LORazepam (ATIVAN) 0.5 MG tablet, Take 1 tablet (0.5 mg total) by mouth every 8 (eight) hours as needed for anxiety., Disp: 20 tablet, Rfl: 0 .  Multiple Vitamins-Minerals (MENS MULTI VITAMIN & MINERAL PO), Take by mouth daily., Disp: , Rfl:  .  naproxen (NAPROSYN) 375 MG tablet, Take 1 tablet (375 mg total) by mouth 2 (two) times daily., Disp: 20 tablet, Rfl: 0 .  naproxen sodium (ANAPROX) 220 MG tablet, Take 220 mg by mouth 2 (two) times daily with a meal., Disp: , Rfl:  .  oseltamivir (TAMIFLU) 75 MG capsule, Take 1 capsule (75 mg total) by mouth 2 (two) times daily. (Patient not taking: Reported on 11/26/2018), Disp: 10 capsule, Rfl: 0 .  tamsulosin (FLOMAX) 0.4 MG CAPS capsule, Take 1 capsule (0.4 mg total) by mouth daily. (Patient not taking: Reported on 11/26/2018), Disp: 20 capsule, Rfl: 0 .  triamcinolone cream (KENALOG) 0.1 %, Apply 1 application topically 2 (two) times daily as needed., Disp: 453 g, Rfl: 1 .  valsartan (DIOVAN) 80 MG tablet, Take 1 tablet (80 mg total) by mouth daily., Disp: 30 tablet, Rfl: 5  EXAM:  VITALS per patient if applicable:  GENERAL: alert, oriented, appears well and in no acute distress  HEENT: atraumatic, conjunttiva clear, no obvious abnormalities on inspection of external nose and ears  NECK: normal movements of the head and neck  LUNGS: on inspection no signs of respiratory distress, breathing rate appears normal, no obvious gross SOB, gasping or wheezing  CV: no obvious cyanosis  MS: moves all visible extremities without noticeable  abnormality  PSYCH/NEURO: pleasant and cooperative, no obvious depression or anxiety, speech and thought processing grossly intact  ASSESSMENT AND PLAN:  Discussed the following assessment and plan:  #1 intermittent left upper quadrant abdominal pain.  Etiology unclear.  Question gastritis versus peptic ulcer disease versus other.  We reviewed red flags such as hematemesis, melena, progressive pain, abdominal distention, fever, and he has none.  -Recommend gradually reduce caffeine use -Avoid all non-steroidals -Recommend consider trial of over-the-counter Nexium or Prilosec and be in touch if not seeing improvement over the next couple of weeks and sooner as needed -If not improving with the above will consider lab work and imaging to further assess  #2 mood disorder.  Patient scored 12 on PHQ-9.  He also has concerns about pervasive worries and possible generalized anxiety  -Continue regular counseling with his therapist -Consider trial of Lexapro 10 mg once daily.  Reviewed potential side effects. -Recommend 3 to 4-week follow-up to reassess  #  3 chronic sleep disturbance  -We discussed seeing how he responds to Lexapro above.  There is fairly low clinical suspicion for obstructive sleep apnea now-with his recent weight loss but consider sleep studies if not improving with the above     I discussed the assessment and treatment plan with the patient. The patient was provided an opportunity to ask questions and all were answered. The patient agreed with the plan and demonstrated an understanding of the instructions.   The patient was advised to call back or seek an in-person evaluation if the symptoms worsen or if the condition fails to improve as anticipated.   Evelena PeatBruce Kimoni Pickerill, MD

## 2019-02-13 ENCOUNTER — Ambulatory Visit: Payer: BC Managed Care – PPO | Admitting: Family Medicine

## 2019-02-20 DIAGNOSIS — F4321 Adjustment disorder with depressed mood: Secondary | ICD-10-CM | POA: Diagnosis not present

## 2019-03-04 ENCOUNTER — Other Ambulatory Visit: Payer: Self-pay | Admitting: Family Medicine

## 2019-03-05 NOTE — Telephone Encounter (Signed)
Message routed to PCP CMA  

## 2019-03-06 DIAGNOSIS — F4321 Adjustment disorder with depressed mood: Secondary | ICD-10-CM | POA: Diagnosis not present

## 2019-03-06 MED ORDER — LISDEXAMFETAMINE DIMESYLATE 40 MG PO CAPS: ORAL_CAPSULE | ORAL | 0 refills | Status: DC

## 2019-03-06 NOTE — Telephone Encounter (Signed)
Last OV 02/09/19, No future OV  Last filled 12/24/18, #30 with 2 refills  Can you send this in for 30 days and Dr. Elease Hashimoto can send the rest when he returns?

## 2019-03-06 NOTE — Telephone Encounter (Signed)
Forwarding to PCP CMA.  

## 2019-03-06 NOTE — Telephone Encounter (Signed)
Please see request

## 2019-03-08 NOTE — Telephone Encounter (Signed)
We sent in 3 months of refills 12-24-18 so should have enough to last until 10-8.

## 2019-03-27 DIAGNOSIS — F4321 Adjustment disorder with depressed mood: Secondary | ICD-10-CM | POA: Diagnosis not present

## 2019-03-31 ENCOUNTER — Other Ambulatory Visit: Payer: Self-pay | Admitting: Family Medicine

## 2019-04-01 ENCOUNTER — Other Ambulatory Visit: Payer: Self-pay | Admitting: Family Medicine

## 2019-04-06 MED ORDER — LISDEXAMFETAMINE DIMESYLATE 40 MG PO CAPS: ORAL_CAPSULE | ORAL | 0 refills | Status: DC

## 2019-04-06 MED ORDER — LISDEXAMFETAMINE DIMESYLATE 40 MG PO CAPS: 40.0000 mg | ORAL_CAPSULE | ORAL | 0 refills | Status: DC

## 2019-04-12 ENCOUNTER — Other Ambulatory Visit: Payer: Self-pay | Admitting: Family Medicine

## 2019-04-24 DIAGNOSIS — F4321 Adjustment disorder with depressed mood: Secondary | ICD-10-CM | POA: Diagnosis not present

## 2019-05-07 ENCOUNTER — Other Ambulatory Visit: Payer: Self-pay | Admitting: Family Medicine

## 2019-05-11 ENCOUNTER — Other Ambulatory Visit: Payer: Self-pay | Admitting: Family Medicine

## 2019-05-11 NOTE — Telephone Encounter (Signed)
Should have refills.  Sent in to pharmacy for 3 refills on 04-06-19.

## 2019-05-12 NOTE — Telephone Encounter (Signed)
Please refuse this medication with note that was already sent on 04/06/19 with refills. They system will not let me refuse this. Thanks!

## 2019-05-13 ENCOUNTER — Telehealth: Payer: Self-pay | Admitting: Family Medicine

## 2019-05-13 ENCOUNTER — Other Ambulatory Visit: Payer: Self-pay

## 2019-05-13 MED ORDER — ESCITALOPRAM OXALATE 10 MG PO TABS: 10.0000 mg | ORAL_TABLET | Freq: Every day | ORAL | 1 refills | Status: DC

## 2019-05-13 NOTE — Telephone Encounter (Signed)
OK to refill

## 2019-05-13 NOTE — Telephone Encounter (Signed)
Not sure how he did this. Please advise.

## 2019-05-13 NOTE — Telephone Encounter (Signed)
Copied from [place] [phone]. Topic: General - Other >> May 13, 2019  9:01 AM Keene Breath wrote: Reason for CRM: Patient called to request a refill for his escitalopram (LEXAPRO) 10 MG tablet medication.  Stated that he dropped a 3 month supply into his sink by accident.  Please advise and call patient to discuss at [phone]

## 2019-05-13 NOTE — Telephone Encounter (Signed)
Updated refill has been sent to the [facility] on [address] for this patient.

## 2019-05-22 DIAGNOSIS — F4321 Adjustment disorder with depressed mood: Secondary | ICD-10-CM | POA: Diagnosis not present

## 2019-06-01 ENCOUNTER — Encounter: Payer: Self-pay | Admitting: Family Medicine

## 2019-06-05 ENCOUNTER — Other Ambulatory Visit: Payer: Self-pay

## 2019-06-05 ENCOUNTER — Telehealth (INDEPENDENT_AMBULATORY_CARE_PROVIDER_SITE_OTHER): Payer: BC Managed Care – PPO | Admitting: Family Medicine

## 2019-06-05 DIAGNOSIS — F988 Other specified behavioral and emotional disorders with onset usually occurring in childhood and adolescence: Secondary | ICD-10-CM

## 2019-06-05 DIAGNOSIS — F419 Anxiety disorder, unspecified: Secondary | ICD-10-CM

## 2019-06-05 DIAGNOSIS — I10 Essential (primary) hypertension: Secondary | ICD-10-CM | POA: Diagnosis not present

## 2019-06-05 MED ORDER — VALSARTAN 80 MG PO TABS: 80.0000 mg | ORAL_TABLET | Freq: Every day | ORAL | 3 refills | Status: DC

## 2019-06-07 NOTE — Progress Notes (Signed)
This visit type was conducted due to national recommendations for restrictions regarding the COVID-19 pandemic in an effort to limit this patient's exposure and mitigate transmission in our community.   Virtual Visit via Video Note  I connected with Jeffrey [name] on 06/07/19 at  3:30 PM EST by a video enabled telemedicine application and verified that I am speaking with the correct person using two identifiers.  Location patient: home Location provider:work or home office Persons participating in the virtual visit: patient, provider  I discussed the limitations of evaluation and management by telemedicine and the availability of in person appointments. The patient expressed understanding and agreed to proceed.   HPI: [name] had set up virtual follow up to discuss the following:  Hx of generalized anxiety symptoms.  He score 12 on recent PHQ-9.   He had multiple stressors back in the Fall.  We started Lexapro and he does feel this has helped his daily/pervasive anxiety symptoms.  Tolerating well with no side effects.  He has had some mild recent weight gain and wonders if that might be due to the Lexapro.  Some inconsistency with exercise.  Hypertension treated with Diovan.  BP has been stable.  No dizziness.   No headaches.  ADD.   Takes Vyvanse and that is generally working well.    Sometimes does not take on weekends.  No side effects.   ROS: See pertinent positives and negatives per HPI.  Past Medical History:  Diagnosis Date  . Asthma     Past Surgical History:  Procedure Laterality Date  . ANTERIOR CRUCIATE LIGAMENT REPAIR Right 2010  . TONSILLECTOMY AND ADENOIDECTOMY     as a child    Family History  Problem Relation Age of Onset  . Cancer Father        prostate  . Heart disease Father   . Hypertension Father   . Heart disease Maternal Grandmother   . Heart disease Maternal Grandfather   . Heart disease Paternal Grandmother   . Heart disease Paternal Grandfather      SOCIAL HX: non-smoker.  Works for [employer].  Married with one child.     Current Outpatient Medications:  .  albuterol (VENTOLIN HFA) 108 (90 Base) MCG/ACT inhaler, Inhale 1-2 puffs into the lungs every 6 (six) hours as needed for wheezing or shortness of breath., Disp: 1 Inhaler, Rfl: 2 .  augmented betamethasone dipropionate (DIPROLENE-AF) 0.05 % ointment, Apply topically 2 (two) times daily., Disp: 15 g, Rfl: 1 .  cyclobenzaprine (FLEXERIL) 10 MG tablet, Take 1 tablet (10 mg total) by mouth 3 (three) times daily as needed for muscle spasms. (Patient not taking: Reported on 11/26/2018), Disp: 30 tablet, Rfl: 0 .  Desoximetasone (TOPICORT) 0.25 % ointment, Apply 1 application topically 2 (two) times daily as needed., Disp: 30 g, Rfl: 2 .  escitalopram (LEXAPRO) 10 MG tablet, Take 1 tablet (10 mg total) by mouth daily., Disp: 90 tablet, Rfl: 1 .  lisdexamfetamine (VYVANSE) 40 MG capsule, Take one capsule by mouth every morning.  May refill in two months., Disp: 30 capsule, Rfl: 0 .  lisdexamfetamine (VYVANSE) 40 MG capsule, Take one capsule by mouth every morning.  May refill in one month, Disp: 30 capsule, Rfl: 0 .  lisdexamfetamine (VYVANSE) 40 MG capsule, Take 1 capsule (40 mg total) by mouth every morning., Disp: 30 capsule, Rfl: 0 .  Multiple Vitamins-Minerals (MENS MULTI VITAMIN & MINERAL PO), Take by mouth daily., Disp: , Rfl:  .  naproxen (NAPROSYN) 375  MG tablet, Take 1 tablet (375 mg total) by mouth 2 (two) times daily., Disp: 20 tablet, Rfl: 0 .  naproxen sodium (ANAPROX) 220 MG tablet, Take 220 mg by mouth 2 (two) times daily with a meal., Disp: , Rfl:  .  tamsulosin (FLOMAX) 0.4 MG CAPS capsule, Take 1 capsule (0.4 mg total) by mouth daily. (Patient not taking: Reported on 11/26/2018), Disp: 20 capsule, Rfl: 0 .  triamcinolone cream (KENALOG) 0.1 %, Apply 1 application topically 2 (two) times daily as needed., Disp: 453 g, Rfl: 1 .  valsartan (DIOVAN) 80 MG tablet, Take 1 tablet (80  mg total) by mouth daily., Disp: 90 tablet, Rfl: 3  EXAM:  VITALS per patient if applicable:  GENERAL: alert, oriented, appears well and in no acute distress  HEENT: atraumatic, conjunttiva clear, no obvious abnormalities on inspection of external nose and ears  NECK: normal movements of the head and neck  LUNGS: on inspection no signs of respiratory distress, breathing rate appears normal, no obvious gross SOB, gasping or wheezing  CV: no obvious cyanosis  MS: moves all visible extremities without noticeable abnormality  PSYCH/NEURO: pleasant and cooperative, no obvious depression or anxiety, speech and thought processing grossly intact  ASSESSMENT AND PLAN:  Discussed the following assessment and plan:  #1 generalized anxiety symptoms and depressed mood improved with Lexapro -continue with current dose  #2 Hypertension- stable and at goal.  #3 ADD - stable on Vyvanse.     I discussed the assessment and treatment plan with the patient. The patient was provided an opportunity to ask questions and all were answered. The patient agreed with the plan and demonstrated an understanding of the instructions.   The patient was advised to call back or seek an in-person evaluation if the symptoms worsen or if the condition fails to improve as anticipated.     Evelena Peat, MD

## 2019-06-13 ENCOUNTER — Other Ambulatory Visit: Payer: Self-pay | Admitting: Family Medicine

## 2019-06-15 NOTE — Telephone Encounter (Signed)
Should have a refill left at pharmacy

## 2019-06-15 NOTE — Telephone Encounter (Signed)
Last 3 Rxs given on 10/19 for #30

## 2019-06-16 NOTE — Telephone Encounter (Signed)
Rx denial sent to the pharmacy with a note attached stating information as below.

## 2019-06-16 NOTE — Telephone Encounter (Signed)
Unable to send denial via escribe.  [facility], spoke with Verdis Frederickson to inform her of the message below.  Verdis Frederickson stated she does see the refill on file and this will be filled for the pt.

## 2019-06-24 ENCOUNTER — Encounter: Payer: Self-pay | Admitting: Family Medicine

## 2019-06-26 DIAGNOSIS — F4321 Adjustment disorder with depressed mood: Secondary | ICD-10-CM | POA: Diagnosis not present

## 2019-07-01 ENCOUNTER — Ambulatory Visit: Payer: BC Managed Care – PPO | Attending: Internal Medicine

## 2019-07-01 DIAGNOSIS — Z20822 Contact with and (suspected) exposure to covid-19: Secondary | ICD-10-CM

## 2019-07-02 LAB — NOVEL CORONAVIRUS, NAA: SARS-CoV-2, NAA: NOT DETECTED

## 2019-07-14 ENCOUNTER — Ambulatory Visit: Payer: BC Managed Care – PPO | Attending: Internal Medicine

## 2019-07-14 DIAGNOSIS — Z20822 Contact with and (suspected) exposure to covid-19: Secondary | ICD-10-CM

## 2019-07-15 ENCOUNTER — Telehealth: Payer: BC Managed Care – PPO | Admitting: Family Medicine

## 2019-07-15 ENCOUNTER — Other Ambulatory Visit: Payer: Self-pay

## 2019-07-15 ENCOUNTER — Telehealth (INDEPENDENT_AMBULATORY_CARE_PROVIDER_SITE_OTHER): Payer: BC Managed Care – PPO | Admitting: Family Medicine

## 2019-07-15 DIAGNOSIS — R05 Cough: Secondary | ICD-10-CM | POA: Diagnosis not present

## 2019-07-15 DIAGNOSIS — R059 Cough, unspecified: Secondary | ICD-10-CM

## 2019-07-15 LAB — NOVEL CORONAVIRUS, NAA: SARS-CoV-2, NAA: NOT DETECTED

## 2019-07-15 NOTE — Progress Notes (Signed)
This visit type was conducted due to national recommendations for restrictions regarding the COVID-19 pandemic in an effort to limit this patient's exposure and mitigate transmission in our community.   Virtual Visit via Video Note  I connected with Jeffrey [name] on 07/15/19 at 11:45 AM EST by a video enabled telemedicine application and verified that I am speaking with the correct person using two identifiers.  Location patient: home Location provider:work or home office Persons participating in the virtual visit: patient, provider  I discussed the limitations of evaluation and management by telemedicine and the availability of in person appointments. The patient expressed understanding and agreed to proceed.   HPI: Leaf called with respiratory symptoms past couple weeks.  2 weeks ago he had some nasal congestion and sinus congestion and early cough and had Covid test which is negative.  He then went again yesterday and had test done which is pending.  His daughter has been in daycare and has had some respiratory symptoms past couple of weeks.  Segundo's main symptom is cough productive of green sputum.  Occasional mild headache.  No fever.  No dyspnea.  No loss of taste or smell.  No purulent nasal secretions.  He has some leftover Cheratussin and had questions whether he can take that for severe nighttime coughing.  He has albuterol which he is taking at night for intermittent wheezing.  No dyspnea at rest.  He is a non-smoker.   ROS: See pertinent positives and negatives per HPI.  Past Medical History:  Diagnosis Date  . Asthma     Past Surgical History:  Procedure Laterality Date  . ANTERIOR CRUCIATE LIGAMENT REPAIR Right 2010  . TONSILLECTOMY AND ADENOIDECTOMY     as a child    Family History  Problem Relation Age of Onset  . Cancer Father        prostate  . Heart disease Father   . Hypertension Father   . Heart disease Maternal Grandmother   . Heart disease Maternal  Grandfather   . Heart disease Paternal Grandmother   . Heart disease Paternal Grandfather     SOCIAL HX: Non-smoker   Current Outpatient Medications:  .  albuterol (VENTOLIN HFA) 108 (90 Base) MCG/ACT inhaler, Inhale 1-2 puffs into the lungs every 6 (six) hours as needed for wheezing or shortness of breath., Disp: 1 Inhaler, Rfl: 2 .  augmented betamethasone dipropionate (DIPROLENE-AF) 0.05 % ointment, Apply topically 2 (two) times daily., Disp: 15 g, Rfl: 1 .  cyclobenzaprine (FLEXERIL) 10 MG tablet, Take 1 tablet (10 mg total) by mouth 3 (three) times daily as needed for muscle spasms. (Patient not taking: Reported on 11/26/2018), Disp: 30 tablet, Rfl: 0 .  Desoximetasone (TOPICORT) 0.25 % ointment, Apply 1 application topically 2 (two) times daily as needed., Disp: 30 g, Rfl: 2 .  escitalopram (LEXAPRO) 10 MG tablet, Take 1 tablet (10 mg total) by mouth daily., Disp: 90 tablet, Rfl: 1 .  lisdexamfetamine (VYVANSE) 40 MG capsule, Take one capsule by mouth every morning.  May refill in two months., Disp: 30 capsule, Rfl: 0 .  lisdexamfetamine (VYVANSE) 40 MG capsule, Take one capsule by mouth every morning.  May refill in one month, Disp: 30 capsule, Rfl: 0 .  lisdexamfetamine (VYVANSE) 40 MG capsule, Take 1 capsule (40 mg total) by mouth every morning., Disp: 30 capsule, Rfl: 0 .  Multiple Vitamins-Minerals (MENS MULTI VITAMIN & MINERAL PO), Take by mouth daily., Disp: , Rfl:  .  naproxen (NAPROSYN) 375 MG tablet,  Take 1 tablet (375 mg total) by mouth 2 (two) times daily., Disp: 20 tablet, Rfl: 0 .  naproxen sodium (ANAPROX) 220 MG tablet, Take 220 mg by mouth 2 (two) times daily with a meal., Disp: , Rfl:  .  tamsulosin (FLOMAX) 0.4 MG CAPS capsule, Take 1 capsule (0.4 mg total) by mouth daily. (Patient not taking: Reported on 11/26/2018), Disp: 20 capsule, Rfl: 0 .  triamcinolone cream (KENALOG) 0.1 %, Apply 1 application topically 2 (two) times daily as needed., Disp: 453 g, Rfl: 1 .   valsartan (DIOVAN) 80 MG tablet, Take 1 tablet (80 mg total) by mouth daily., Disp: 90 tablet, Rfl: 3  EXAM:  VITALS per patient if applicable:  GENERAL: alert, oriented, appears well and in no acute distress  HEENT: atraumatic, conjunttiva clear, no obvious abnormalities on inspection of external nose and ears  NECK: normal movements of the head and neck  LUNGS: on inspection no signs of respiratory distress, breathing rate appears normal, no obvious gross SOB, gasping or wheezing  CV: no obvious cyanosis  MS: moves all visible extremities without noticeable abnormality  PSYCH/NEURO: pleasant and cooperative, no obvious depression or anxiety, speech and thought processing grossly intact  ASSESSMENT AND PLAN:  Discussed the following assessment and plan:  Probable acute viral bronchitis.  Patient has repeat Covid testing which is pending.  No respiratory distress.  -We recommended quarantine until further clarified with Covid testing. -Plenty of fluids and rest and stay well-hydrated -He has Cheratussin AC cough syrup which he will take at night for severe cough symptoms -Follow-up promptly for any fever or any worsening symptoms.  We made him aware of our respiratory clinic as an option if symptoms should worsen    I discussed the assessment and treatment plan with the patient. The patient was provided an opportunity to ask questions and all were answered. The patient agreed with the plan and demonstrated an understanding of the instructions.   The patient was advised to call back or seek an in-person evaluation if the symptoms worsen or if the condition fails to improve as anticipated.     Carolann Littler, MD

## 2019-07-17 ENCOUNTER — Ambulatory Visit: Payer: BC Managed Care – PPO

## 2019-07-18 ENCOUNTER — Other Ambulatory Visit: Payer: Self-pay | Admitting: Family Medicine

## 2019-07-20 MED ORDER — LISDEXAMFETAMINE DIMESYLATE 40 MG PO CAPS: ORAL_CAPSULE | ORAL | 0 refills | Status: DC

## 2019-07-20 MED ORDER — LISDEXAMFETAMINE DIMESYLATE 40 MG PO CAPS: 40.0000 mg | ORAL_CAPSULE | ORAL | 0 refills | Status: DC

## 2019-07-21 ENCOUNTER — Encounter: Payer: Self-pay | Admitting: Family Medicine

## 2019-07-21 MED ORDER — HYDROCODONE-HOMATROPINE 5-1.5 MG/5ML PO SYRP: 5.0000 mL | ORAL_SOLUTION | Freq: Four times a day (QID) | ORAL | 0 refills | Status: AC | PRN

## 2019-07-21 MED ORDER — DOXYCYCLINE HYCLATE 100 MG PO CAPS: 100.0000 mg | ORAL_CAPSULE | Freq: Two times a day (BID) | ORAL | 0 refills | Status: DC

## 2019-07-21 NOTE — Telephone Encounter (Signed)
Let him know I sent in prescription for doxycycline and also Hycodan cough syrup to take at night for severe cough.  Follow-up/touch base for any fever or increased shortness of breath.  We do have option of respiratory clinic which is Monday through Friday 530 to 7:30 PM if symptoms worsen

## 2019-07-25 ENCOUNTER — Ambulatory Visit: Payer: BC Managed Care – PPO | Attending: Internal Medicine

## 2019-07-25 DIAGNOSIS — Z23 Encounter for immunization: Secondary | ICD-10-CM | POA: Insufficient documentation

## 2019-07-25 NOTE — Progress Notes (Signed)
   Covid-19 Vaccination Clinic  Name:  Alante Tolan    MRN: [id number] DOB: 01-02-1981  07/25/2019  Mr. Brandenburger was observed post Covid-19 immunization for 15 minutes without incidence. He was provided with Vaccine Information Sheet and instruction to access the V-Safe system.   Mr. Andonian was instructed to call 911 with any severe reactions post vaccine: Marland Kitchen Difficulty breathing  . Swelling of your face and throat  . A fast heartbeat  . A bad rash all over your body  . Dizziness and weakness    Immunizations Administered    Name Date Dose VIS Date Route   Pfizer COVID-19 Vaccine 07/25/2019  5:15 PM 0.3 mL 05/29/2019 Intramuscular   Manufacturer: [employer], [employer]   Lot: [id number]   NDC: [address]-4

## 2019-08-07 ENCOUNTER — Ambulatory Visit: Payer: BC Managed Care – PPO

## 2019-08-07 ENCOUNTER — Encounter: Payer: Self-pay | Admitting: Family Medicine

## 2019-08-10 MED ORDER — AMPHETAMINE-DEXTROAMPHETAMINE 20 MG PO TABS: 20.0000 mg | ORAL_TABLET | Freq: Two times a day (BID) | ORAL | 0 refills | Status: DC

## 2019-08-10 NOTE — Telephone Encounter (Signed)
I made switch back from Vyvanse to Adderall 20 mg twice daily.  Recommend feedback in 1 month to make sure this is the right dosage

## 2019-08-19 ENCOUNTER — Ambulatory Visit: Payer: BC Managed Care – PPO | Attending: Internal Medicine

## 2019-08-19 DIAGNOSIS — Z23 Encounter for immunization: Secondary | ICD-10-CM | POA: Insufficient documentation

## 2019-08-19 NOTE — Progress Notes (Signed)
   Covid-19 Vaccination Clinic  Name:  Aizik Reh    MRN: [id number] DOB: 04-Dec-1980  08/19/2019  Mr. Lovvorn was observed post Covid-19 immunization for 15 minutes without incident. He was provided with Vaccine Information Sheet and instruction to access the V-Safe system.   Mr. Mccreedy was instructed to call 911 with any severe reactions post vaccine: Marland Kitchen Difficulty breathing  . Swelling of face and throat  . A fast heartbeat  . A bad rash all over body  . Dizziness and weakness   Immunizations Administered    Name Date Dose VIS Date Route   Pfizer COVID-19 Vaccine 08/19/2019  2:10 PM 0.3 mL 05/29/2019 Intramuscular   Manufacturer: [employer], [employer]   Lot: [id number]   NDC: [address]-0

## 2019-08-28 DIAGNOSIS — F4321 Adjustment disorder with depressed mood: Secondary | ICD-10-CM | POA: Diagnosis not present

## 2019-08-31 ENCOUNTER — Encounter: Payer: Self-pay | Admitting: Family Medicine

## 2019-09-01 MED ORDER — LISDEXAMFETAMINE DIMESYLATE 40 MG PO CAPS: 40.0000 mg | ORAL_CAPSULE | ORAL | 0 refills | Status: DC

## 2019-10-01 ENCOUNTER — Other Ambulatory Visit: Payer: Self-pay | Admitting: Family Medicine

## 2019-10-01 NOTE — Telephone Encounter (Signed)
Forwarding to PCP CMA.  

## 2019-10-02 NOTE — Telephone Encounter (Signed)
Please advise 

## 2019-10-04 MED ORDER — LISDEXAMFETAMINE DIMESYLATE 40 MG PO CAPS: ORAL_CAPSULE | ORAL | 0 refills | Status: DC

## 2019-10-04 MED ORDER — LISDEXAMFETAMINE DIMESYLATE 40 MG PO CAPS: 40.0000 mg | ORAL_CAPSULE | ORAL | 0 refills | Status: DC

## 2019-10-23 DIAGNOSIS — F4321 Adjustment disorder with depressed mood: Secondary | ICD-10-CM | POA: Diagnosis not present

## 2019-11-03 ENCOUNTER — Other Ambulatory Visit: Payer: Self-pay | Admitting: Family Medicine

## 2019-11-18 ENCOUNTER — Encounter: Payer: Self-pay | Admitting: Family Medicine

## 2019-11-18 ENCOUNTER — Telehealth (INDEPENDENT_AMBULATORY_CARE_PROVIDER_SITE_OTHER): Payer: BC Managed Care – PPO | Admitting: Family Medicine

## 2019-11-18 DIAGNOSIS — J069 Acute upper respiratory infection, unspecified: Secondary | ICD-10-CM

## 2019-11-18 DIAGNOSIS — Z20822 Contact with and (suspected) exposure to covid-19: Secondary | ICD-10-CM | POA: Diagnosis not present

## 2019-11-18 DIAGNOSIS — Z03818 Encounter for observation for suspected exposure to other biological agents ruled out: Secondary | ICD-10-CM | POA: Diagnosis not present

## 2019-11-18 DIAGNOSIS — Z7189 Other specified counseling: Secondary | ICD-10-CM

## 2019-11-18 NOTE — Progress Notes (Signed)
Virtual Visit via Video Note  I connected with@ on 11/18/19 at  4:00 PM EDT by a video enabled telemedicine application 2/2 COVID-19 pandemic and verified that I am speaking with the correct person using two identifiers.  Location patient: home Location provider:work or home office Persons participating in the virtual visit: patient, provider  I discussed the limitations of evaluation and management by telemedicine and the availability of in person appointments. The patient expressed understanding and agreed to proceed.   HPI: Pt is a 39 yo male with pmh sig for asthma followed by Dr. Caryl Never who was seen for acute concern.  Pt with chills, congestion, body aches, fever tmax 100.73F, lethargy, and cough x 3-4 days.  Pt's daughter, who is in daycare and teething, was sick recently.  She is now better and back at daycare.  Given his symptoms pt [facility] which [facility].  Taking Dayquil, nyquil.  Using inhaler sparingly prn.    ROS: See pertinent positives and negatives per HPI.  Past Medical History:  Diagnosis Date  . Asthma     Past Surgical History:  Procedure Laterality Date  . ANTERIOR CRUCIATE LIGAMENT REPAIR Right 2010  . TONSILLECTOMY AND ADENOIDECTOMY     as a child    Family History  Problem Relation Age of Onset  . Cancer Father        prostate  . Heart disease Father   . Hypertension Father   . Heart disease Maternal Grandmother   . Heart disease Maternal Grandfather   . Heart disease Paternal Grandmother   . 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  Current Outpatient Medications:  .  albuterol (VENTOLIN HFA) 108 (90 Base) MCG/ACT inhaler, Inhale 1-2 puffs into the lungs every 6 (six) hours as needed for wheezing or shortness of breath., Disp: 1 Inhaler, Rfl: 2 .  augmented betamethasone dipropionate (DIPROLENE-AF) 0.05 % ointment, Apply topically 2 (two) times daily., Disp: 15  g, Rfl: 1 .  cyclobenzaprine (FLEXERIL) 10 MG tablet, Take 1 tablet (10 mg total) by mouth 3 (three) times daily as needed for muscle spasms. (Patient not taking: Reported on 11/26/2018), Disp: 30 tablet, Rfl: 0 .  Desoximetasone (TOPICORT) 0.25 % ointment, Apply 1 application topically 2 (two) times daily as needed., Disp: 30 g, Rfl: 2 .  doxycycline (VIBRAMYCIN) 100 MG capsule, Take 1 capsule (100 mg total) by mouth 2 (two) times daily., Disp: 14 capsule, Rfl: 0 .  escitalopram (LEXAPRO) 10 MG tablet, Take 1 tablet (10 mg total) by mouth daily., Disp: 90 tablet, Rfl: 1 .  lisdexamfetamine (VYVANSE) 40 MG capsule, Take 1 capsule (40 mg total) by mouth every morning., Disp: 30 capsule, Rfl: 0 .  lisdexamfetamine (VYVANSE) 40 MG capsule, Take one capsule by mouth every morning.  May refill in one month, Disp: 30 capsule, Rfl: 0 .  lisdexamfetamine (VYVANSE) 40 MG capsule, Take one capsule by mouth every morning.  May refill in two months., Disp: 30 capsule, Rfl: 0 .  Multiple Vitamins-Minerals (MENS MULTI VITAMIN & MINERAL PO), Take by mouth daily., Disp: , Rfl:  .  naproxen (NAPROSYN) 375 MG tablet, Take 1 tablet (375 mg total) by mouth 2 (two) times daily., Disp: 20 tablet, Rfl: 0 .  naproxen sodium (ANAPROX) 220 MG tablet, Take 220 mg by mouth 2 (two) times daily with a meal., Disp: , Rfl:  .  tamsulosin (FLOMAX) 0.4 MG  CAPS capsule, Take 1 capsule (0.4 mg total) by mouth daily. (Patient not taking: Reported on 11/26/2018), Disp: 20 capsule, Rfl: 0 .  triamcinolone cream (KENALOG) 0.1 %, Apply 1 application topically 2 (two) times daily as needed., Disp: 453 g, Rfl: 1 .  valsartan (DIOVAN) 80 MG tablet, Take 1 tablet (80 mg total) by mouth daily., Disp: 90 tablet, Rfl: 3  EXAM:  VITALS per patient if applicable: RR between 13-08 bpm  GENERAL: alert, oriented, appears well and in no acute distress  HEENT: atraumatic, conjunctiva clear, no obvious abnormalities on inspection of external nose and  ears  NECK: normal movements of the head and neck  LUNGS: on inspection no signs of respiratory distress, breathing rate appears normal, no obvious gross SOB, gasping or wheezing  CV: no obvious cyanosis  MS: moves all visible extremities without noticeable abnormality  PSYCH/NEURO: pleasant and cooperative, no obvious depression or anxiety, speech and thought processing grossly intact  ASSESSMENT AND PLAN:  Discussed the following assessment and plan:  Viral upper respiratory tract infection -Rapid Covid test negative today -Consider other causes such as influenza versus acute nasopharyngitis -Continue supportive care Tylenol, hydration, cough/cold medicine -Continue self quarantine -Given precautions  Educated about COVID-19 virus infection -discussed s/s of COVID 19 infection -consider retesting for continued or worsened symptoms  Follow-up as needed   I discussed the assessment and treatment plan with the patient. The patient was provided an opportunity to ask questions and all were answered. The patient agreed with the plan and demonstrated an understanding of the instructions.   The patient was advised to call back or seek an in-person evaluation if the symptoms worsen or if the condition fails to improve as anticipated.  I provided 15 minutes of non-face-to-face time during this encounter.   Billie Ruddy, MD

## 2019-12-04 DIAGNOSIS — F4321 Adjustment disorder with depressed mood: Secondary | ICD-10-CM | POA: Diagnosis not present

## 2019-12-16 ENCOUNTER — Encounter: Payer: Self-pay | Admitting: Family Medicine

## 2019-12-17 MED ORDER — TRIAMCINOLONE ACETONIDE 0.1 % EX CREA: 1.0000 "application " | TOPICAL_CREAM | Freq: Two times a day (BID) | CUTANEOUS | 1 refills | Status: DC | PRN

## 2020-01-04 ENCOUNTER — Other Ambulatory Visit: Payer: Self-pay | Admitting: Family Medicine

## 2020-01-05 MED ORDER — LISDEXAMFETAMINE DIMESYLATE 40 MG PO CAPS: ORAL_CAPSULE | ORAL | 0 refills | Status: DC

## 2020-01-05 MED ORDER — LISDEXAMFETAMINE DIMESYLATE 40 MG PO CAPS: 40.0000 mg | ORAL_CAPSULE | ORAL | 0 refills | Status: DC

## 2020-01-05 NOTE — Telephone Encounter (Signed)
Please advise 

## 2020-01-15 DIAGNOSIS — F4321 Adjustment disorder with depressed mood: Secondary | ICD-10-CM | POA: Diagnosis not present

## 2020-02-03 ENCOUNTER — Encounter: Payer: Self-pay | Admitting: Family Medicine

## 2020-02-03 DIAGNOSIS — L309 Dermatitis, unspecified: Secondary | ICD-10-CM

## 2020-02-16 ENCOUNTER — Other Ambulatory Visit: Payer: Self-pay | Admitting: Family Medicine

## 2020-02-18 ENCOUNTER — Other Ambulatory Visit: Payer: Self-pay | Admitting: Family Medicine

## 2020-02-18 NOTE — Telephone Encounter (Signed)
Pt should still have refills due 03/07/2020. Called pt no answer

## 2020-02-19 DIAGNOSIS — F4321 Adjustment disorder with depressed mood: Secondary | ICD-10-CM | POA: Diagnosis not present

## 2020-03-08 NOTE — Telephone Encounter (Signed)
Okay for refill?    LOV   Last Refill       QTY.         Refills 

## 2020-03-08 NOTE — Telephone Encounter (Signed)
We actually did 3 months of refills on 01-05-20, so should have one more refill left.   Should be due 04-06-20.

## 2020-03-09 NOTE — Telephone Encounter (Signed)
Epic will not allow me to deny.  Please deny the prescription.  thanks

## 2020-03-25 DIAGNOSIS — F4321 Adjustment disorder with depressed mood: Secondary | ICD-10-CM | POA: Diagnosis not present

## 2020-04-15 DIAGNOSIS — Z20822 Contact with and (suspected) exposure to covid-19: Secondary | ICD-10-CM | POA: Diagnosis not present

## 2020-04-28 ENCOUNTER — Other Ambulatory Visit: Payer: Self-pay | Admitting: Family Medicine

## 2020-04-29 DIAGNOSIS — L2084 Intrinsic (allergic) eczema: Secondary | ICD-10-CM | POA: Diagnosis not present

## 2020-05-02 MED ORDER — LISDEXAMFETAMINE DIMESYLATE 40 MG PO CAPS: ORAL_CAPSULE | ORAL | 0 refills | Status: DC

## 2020-05-02 MED ORDER — LISDEXAMFETAMINE DIMESYLATE 40 MG PO CAPS: 40.0000 mg | ORAL_CAPSULE | ORAL | 0 refills | Status: DC

## 2020-05-03 ENCOUNTER — Other Ambulatory Visit: Payer: Self-pay | Admitting: Family Medicine

## 2020-05-13 DIAGNOSIS — F4321 Adjustment disorder with depressed mood: Secondary | ICD-10-CM | POA: Diagnosis not present

## 2020-05-24 DIAGNOSIS — Z1152 Encounter for screening for COVID-19: Secondary | ICD-10-CM | POA: Diagnosis not present

## 2020-05-29 ENCOUNTER — Other Ambulatory Visit: Payer: Self-pay | Admitting: Family Medicine

## 2020-06-02 ENCOUNTER — Other Ambulatory Visit: Payer: Self-pay | Admitting: Family Medicine

## 2020-06-04 ENCOUNTER — Other Ambulatory Visit: Payer: Self-pay | Admitting: Family Medicine

## 2020-06-06 ENCOUNTER — Other Ambulatory Visit: Payer: Self-pay | Admitting: Family Medicine

## 2020-06-07 MED ORDER — ESCITALOPRAM OXALATE 10 MG PO TABS: 10.0000 mg | ORAL_TABLET | Freq: Every day | ORAL | 0 refills | Status: DC

## 2020-06-18 ENCOUNTER — Telehealth: Payer: BC Managed Care – PPO | Admitting: Nurse Practitioner

## 2020-06-18 DIAGNOSIS — J0100 Acute maxillary sinusitis, unspecified: Secondary | ICD-10-CM

## 2020-06-18 MED ORDER — AMOXICILLIN-POT CLAVULANATE 875-125 MG PO TABS: 1.0000 | ORAL_TABLET | Freq: Two times a day (BID) | ORAL | 0 refills | Status: DC

## 2020-06-18 NOTE — Progress Notes (Signed)

## 2020-06-22 ENCOUNTER — Encounter: Payer: Self-pay | Admitting: Family Medicine

## 2020-06-22 ENCOUNTER — Telehealth (INDEPENDENT_AMBULATORY_CARE_PROVIDER_SITE_OTHER): Payer: BC Managed Care – PPO | Admitting: Family Medicine

## 2020-06-22 DIAGNOSIS — H109 Unspecified conjunctivitis: Secondary | ICD-10-CM | POA: Diagnosis not present

## 2020-06-22 MED ORDER — POLYMYXIN B-TRIMETHOPRIM 10000-0.1 UNIT/ML-% OP SOLN
2.0000 [drp] | OPHTHALMIC | 0 refills | Status: DC
Start: 2020-06-22 — End: 2020-07-15

## 2020-06-22 NOTE — Progress Notes (Signed)
Patient ID: Jeffrey [name], male   DOB: [birth year], 40 y.o.   MRN: [id number]   This visit type was conducted due to national recommendations for restrictions regarding the COVID-19 pandemic in an effort to limit this patient's exposure and mitigate transmission in our community.   Virtual Visit via Video Note  I connected with Jeffrey [name] on 06/22/20 at  5:15 PM EST by a video enabled telemedicine application and verified that I am speaking with the correct person using two identifiers.  Location patient: home Location provider:work or home office Persons participating in the virtual visit: patient, provider  I discussed the limitations of evaluation and management by telemedicine and the availability of in person appointments. The patient expressed understanding and agreed to proceed.   HPI:  Jeffrey [name] called with onset couple days ago of some left eye redness and crusted drainage especially in the mornings. He had recent URI symptoms and did virtual visit around Christmas and was started on Augmentin for sinusitis symptoms. He has 3 more days of that prescription. No blurred vision. No eye pain. No right eye symptoms currently.  ROS: See pertinent positives and negatives per HPI.  Past Medical History:  Diagnosis Date  . Asthma     Past Surgical History:  Procedure Laterality Date  . ANTERIOR CRUCIATE LIGAMENT REPAIR Right 2010  . TONSILLECTOMY AND ADENOIDECTOMY     as a child    Family History  Problem Relation Age of Onset  . Cancer Father        prostate  . Heart disease Father   . Hypertension Father   . Heart disease Maternal Grandmother   . Heart disease Maternal Grandfather   . Heart disease Paternal Grandmother   . Heart disease Paternal Grandfather     SOCIAL HX: Non-smoker   Current Outpatient Medications:  .  trimethoprim-polymyxin b (POLYTRIM) ophthalmic solution, Place 2 drops into the left eye every 4 (four) hours., Disp: 10 mL, Rfl: 0 .  albuterol  (VENTOLIN HFA) 108 (90 Base) MCG/ACT inhaler, INHALE 1-2 PUFFS INTO THE LUNGS EVERY 6 (SIX) HOURS AS NEEDED FOR WHEEZING OR SHORTNESS OF BREATH., Disp: 8.5 each, Rfl: 2 .  amoxicillin-clavulanate (AUGMENTIN) 875-125 MG tablet, Take 1 tablet by mouth 2 (two) times daily., Disp: 14 tablet, Rfl: 0 .  augmented betamethasone dipropionate (DIPROLENE-AF) 0.05 % ointment, Apply topically 2 (two) times daily., Disp: 15 g, Rfl: 1 .  cyclobenzaprine (FLEXERIL) 10 MG tablet, Take 1 tablet (10 mg total) by mouth 3 (three) times daily as needed for muscle spasms. (Patient not taking: Reported on 11/26/2018), Disp: 30 tablet, Rfl: 0 .  Desoximetasone (TOPICORT) 0.25 % ointment, Apply 1 application topically 2 (two) times daily as needed., Disp: 30 g, Rfl: 2 .  doxycycline (VIBRAMYCIN) 100 MG capsule, Take 1 capsule (100 mg total) by mouth 2 (two) times daily., Disp: 14 capsule, Rfl: 0 .  escitalopram (LEXAPRO) 10 MG tablet, Take 1 tablet (10 mg total) by mouth daily., Disp: 90 tablet, Rfl: 0 .  lisdexamfetamine (VYVANSE) 40 MG capsule, Take one capsule by mouth every morning.  May refill in one month, Disp: 30 capsule, Rfl: 0 .  lisdexamfetamine (VYVANSE) 40 MG capsule, Take one capsule by mouth every morning.  May refill in two months., Disp: 30 capsule, Rfl: 0 .  lisdexamfetamine (VYVANSE) 40 MG capsule, Take 1 capsule (40 mg total) by mouth every morning., Disp: 30 capsule, Rfl: 0 .  Multiple Vitamins-Minerals (MENS MULTI VITAMIN & MINERAL PO), Take by mouth daily., Disp: ,  Rfl:  .  naproxen (NAPROSYN) 375 MG tablet, Take 1 tablet (375 mg total) by mouth 2 (two) times daily., Disp: 20 tablet, Rfl: 0 .  naproxen sodium (ANAPROX) 220 MG tablet, Take 220 mg by mouth 2 (two) times daily with a meal., Disp: , Rfl:  .  tamsulosin (FLOMAX) 0.4 MG CAPS capsule, Take 1 capsule (0.4 mg total) by mouth daily. (Patient not taking: Reported on 11/26/2018), Disp: 20 capsule, Rfl: 0 .  triamcinolone cream (KENALOG) 0.1 %, Apply 1  application topically 2 (two) times daily as needed., Disp: 453 g, Rfl: 1 .  valsartan (DIOVAN) 80 MG tablet, TAKE 1 TABLET BY MOUTH EVERY DAY, Disp: 90 tablet, Rfl: 3  EXAM:  VITALS per patient if applicable:  GENERAL: alert, oriented, appears well and in no acute distress  HEENT: atraumatic, conjunttiva clear, no obvious abnormalities on inspection of external nose and ears  NECK: normal movements of the head and neck  LUNGS: on inspection no signs of respiratory distress, breathing rate appears normal, no obvious gross SOB, gasping or wheezing  CV: no obvious cyanosis  MS: moves all visible extremities without noticeable abnormality  PSYCH/NEURO: pleasant and cooperative, no obvious depression or anxiety, speech and thought processing grossly intact  ASSESSMENT AND PLAN:  Discussed the following assessment and plan:  Bacterial conjunctivitis of left eye-he has intermittent crusting consistent with likely bacterial infection  -Polytrim ophthalmic drops to use 2 drops left eye every 4 hours while awake. Continue warm compresses. Follow-up or touch base in 3 to 4 days if not improving     I discussed the assessment and treatment plan with the patient. The patient was provided an opportunity to ask questions and all were answered. The patient agreed with the plan and demonstrated an understanding of the instructions.   The patient was advised to call back or seek an in-person evaluation if the symptoms worsen or if the condition fails to improve as anticipated.     Evelena Peat, MD

## 2020-06-24 DIAGNOSIS — F4321 Adjustment disorder with depressed mood: Secondary | ICD-10-CM | POA: Diagnosis not present

## 2020-06-25 DIAGNOSIS — Z1152 Encounter for screening for COVID-19: Secondary | ICD-10-CM | POA: Diagnosis not present

## 2020-07-01 DIAGNOSIS — Z1152 Encounter for screening for COVID-19: Secondary | ICD-10-CM | POA: Diagnosis not present

## 2020-07-12 ENCOUNTER — Encounter: Payer: Self-pay | Admitting: Family Medicine

## 2020-07-15 MED ORDER — AMPHETAMINE-DEXTROAMPHETAMINE 20 MG PO TABS: ORAL_TABLET | ORAL | 0 refills | Status: DC

## 2020-07-15 MED ORDER — AMPHETAMINE-DEXTROAMPHETAMINE 20 MG PO TABS: 20.0000 mg | ORAL_TABLET | Freq: Two times a day (BID) | ORAL | 0 refills | Status: DC

## 2020-07-15 NOTE — Telephone Encounter (Signed)
I have sent in the Adderall 20 mg po bid.   The issue of prior authorizations this time of year is certainly frustrating to patients and providers!

## 2020-07-27 DIAGNOSIS — Z23 Encounter for immunization: Secondary | ICD-10-CM | POA: Diagnosis not present

## 2020-08-10 DIAGNOSIS — F4321 Adjustment disorder with depressed mood: Secondary | ICD-10-CM | POA: Diagnosis not present

## 2020-08-11 DIAGNOSIS — F4321 Adjustment disorder with depressed mood: Secondary | ICD-10-CM | POA: Diagnosis not present

## 2020-08-18 ENCOUNTER — Other Ambulatory Visit: Payer: Self-pay | Admitting: Family Medicine

## 2020-08-18 IMAGING — CT CT PARANASAL SINUSES LIMITED
1 of 2 series · 10 of 13 positions shown, 13 images · non-contrast
Comparison: Prior radiograph from 05/04/2013

CLINICAL DATA: Initial evaluation for recurrent right maxillary
facial swelling, evaluate for chronic sinusitis.

EXAM:
CT PARANASAL SINUS LIMITED WITHOUT CONTRAST
TECHNIQUE: Non-contiguous multidetector CT images of the paranasal sinuses were
obtained in a single plane without contrast.

[Series 4: limited sinus st · axial · 0.30mm/px · z∈[-37,+53]mm · 10 of 12 slices shown, 13 images]
[im 2/12  brain]
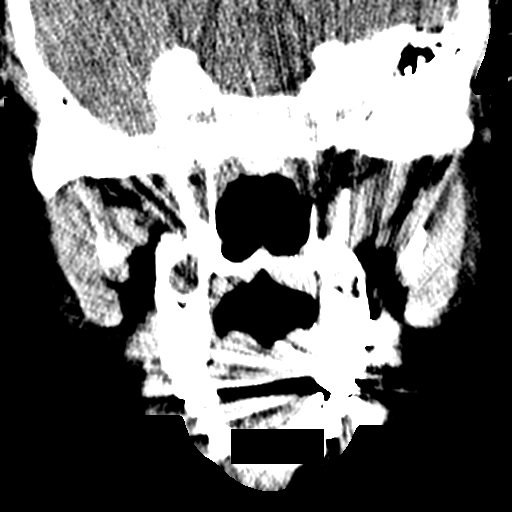
[im 2/12  bone]
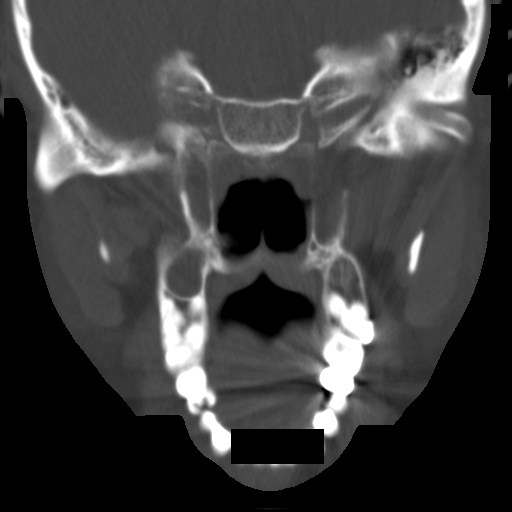
[im 3/12  bone]
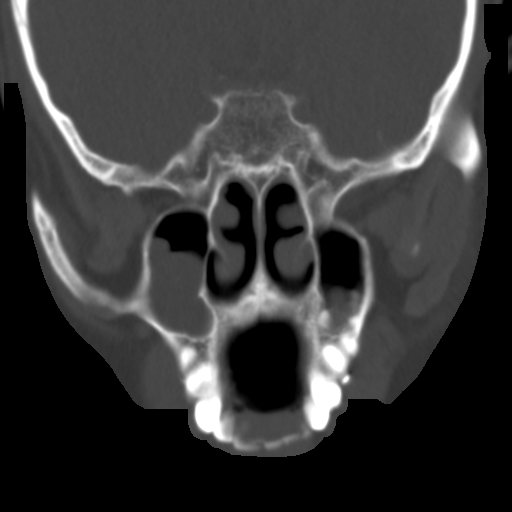
[im 4/12  bone]
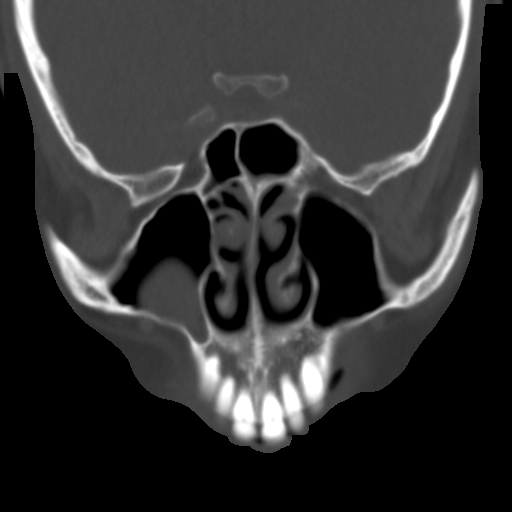
[im 5/12  bone]
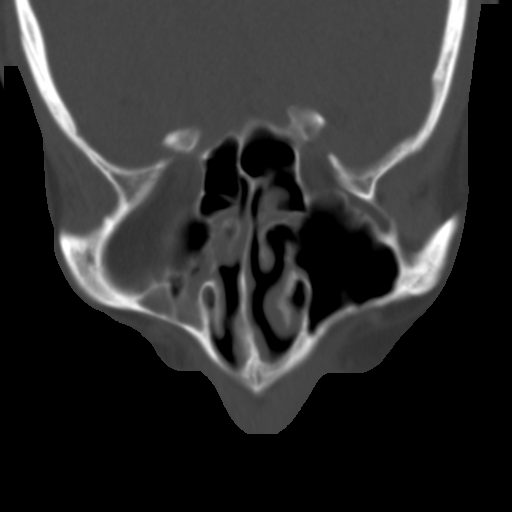
[im 6/12  brain]
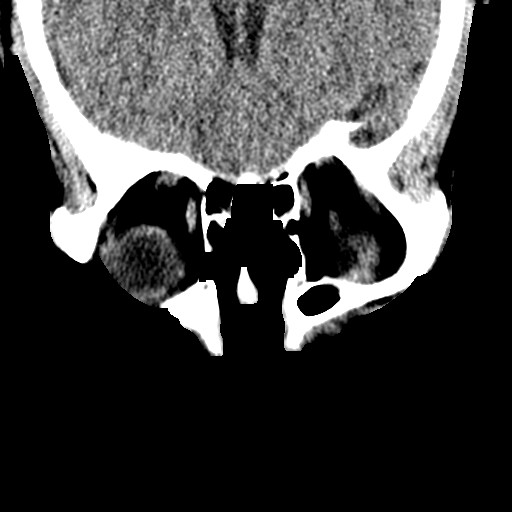
[im 6/12  bone]
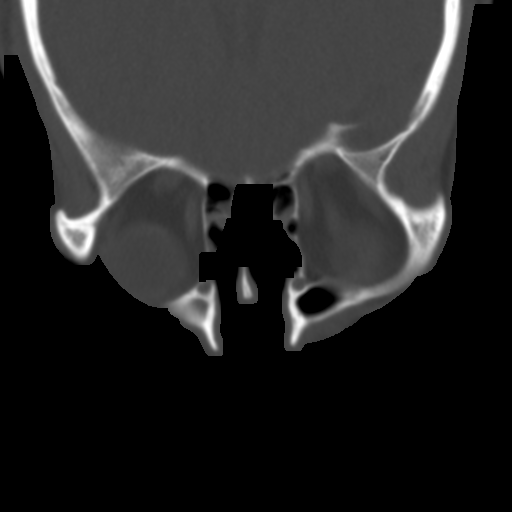
[im 7/12  bone]
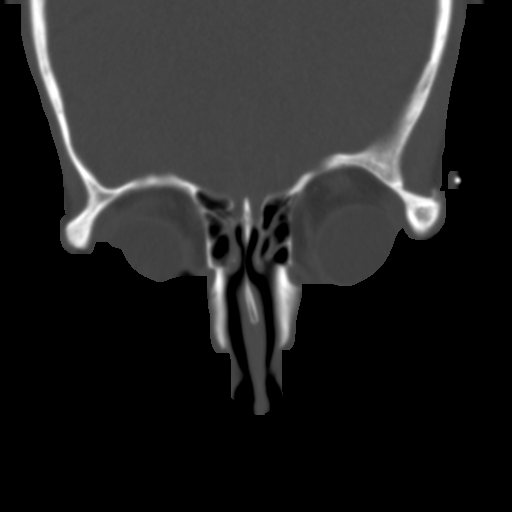
[im 8/12  bone]
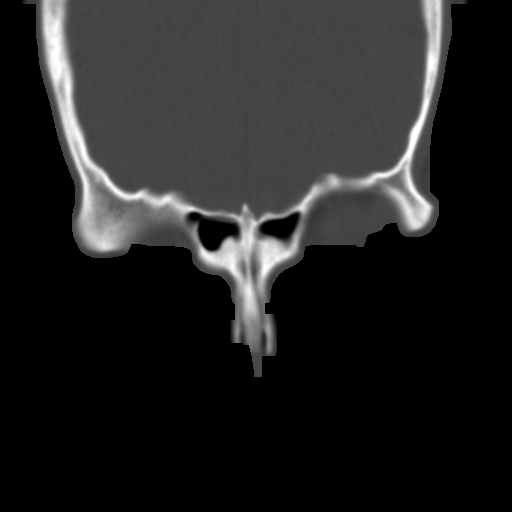
[im 9/12  bone]
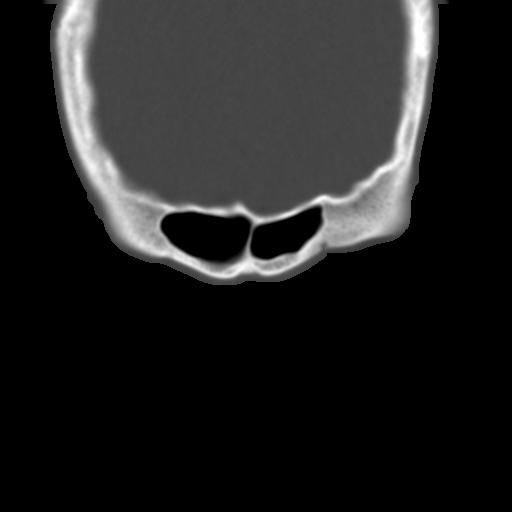
[im 10/12  brain]
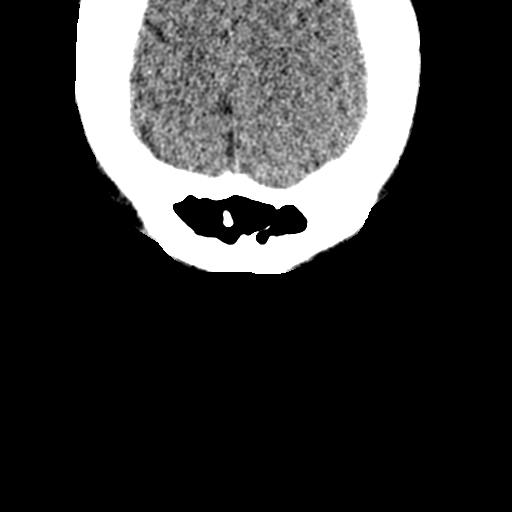
[im 10/12  bone]
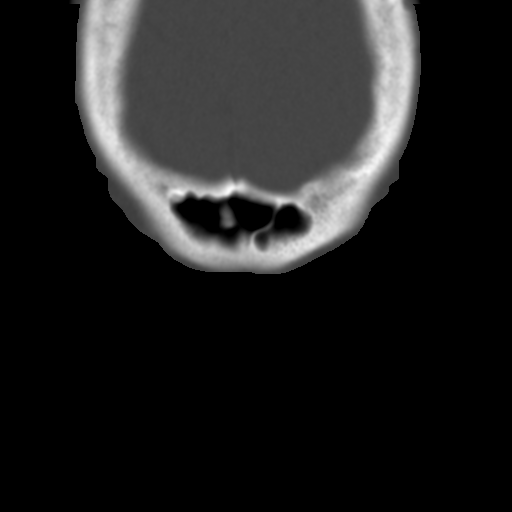
[im 11/12  bone]
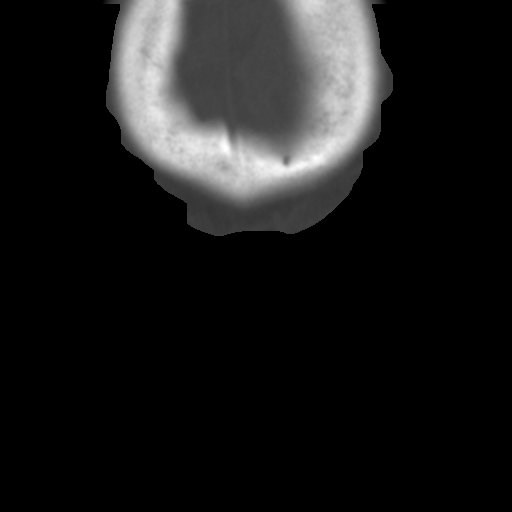

[10 of 13 positions shown; findings below may reference images not displayed]

FINDINGS: Limited coronal views of the paranasal sinuses were performed.

Visualized frontal sinuses well pneumatized and free of fluid.
Scattered mucosal thickening throughout the ethmoidal air cells
bilaterally, left slightly worse than right. Polypoid mucosal
thickening at the inferior maxillary sinuses bilaterally, left
greater than right. Partially visualized right ostiomeatal unit
patent. Left ostiomeatal unit not well seen on this limited exam.
Mild mucosal thickening seen within the sphenoid sinuses
bilaterally.

Nasal passages are largely clear. Mild 2 mm right-to-left nasal
septal deviation. Visualized nasal septum is intact.

No air-fluid levels to suggest acute sinusitis seen at this time. No
significant osseous sclerosis or thickening to suggest chronic
sinusitis.
IMPRESSION: Mild scattered mucosal thickening involving primarily the ethmoidal
air cells and maxillary sinuses bilaterally, left greater than
right. No evidence for acute sinusitis at this time. No significant
visible findings related to underlying chronic sinusitis/osteitis.

## 2020-08-18 NOTE — Telephone Encounter (Signed)
Called [facility] [address], had two Adderall prescriptions on file.  Currently filling one prescription.   Notified patient via My Chart that prescription is currently being filled.

## 2020-09-16 DIAGNOSIS — F4321 Adjustment disorder with depressed mood: Secondary | ICD-10-CM | POA: Diagnosis not present

## 2020-09-21 ENCOUNTER — Other Ambulatory Visit: Payer: Self-pay | Admitting: Family Medicine

## 2020-10-21 DIAGNOSIS — F4321 Adjustment disorder with depressed mood: Secondary | ICD-10-CM | POA: Diagnosis not present

## 2020-10-25 ENCOUNTER — Other Ambulatory Visit: Payer: Self-pay | Admitting: Family Medicine

## 2020-10-25 MED ORDER — AMPHETAMINE-DEXTROAMPHETAMINE 20 MG PO TABS: 20.0000 mg | ORAL_TABLET | Freq: Two times a day (BID) | ORAL | 0 refills | Status: DC

## 2020-10-25 MED ORDER — AMPHETAMINE-DEXTROAMPHETAMINE 20 MG PO TABS: ORAL_TABLET | ORAL | 0 refills | Status: DC

## 2020-10-25 NOTE — Telephone Encounter (Signed)
Last office visit-- 06/22/20

## 2020-10-26 ENCOUNTER — Telehealth: Payer: Self-pay | Admitting: Family Medicine

## 2020-10-26 NOTE — Telephone Encounter (Signed)
Spoke with the patients pharmacy. They confirmed that they do have all 3 prescriptions for adderall and that one is filled and ready for the patient to pick up.   Spoke with the patient. He is aware that his prescription is ready to be picked up and will stop by his pharmacy. Nothing further needed.

## 2020-10-26 NOTE — Telephone Encounter (Signed)
Pt is calling in stating that the pharmacy is telling him that he needs a new prescription sent in for his Adderall.  Pt is aware that the medication was called in on yesterday to the pharmacy.  [facility] [address].  Pt would like to have a call back.

## 2020-11-05 ENCOUNTER — Encounter: Payer: BC Managed Care – PPO | Admitting: Physician Assistant

## 2020-11-05 ENCOUNTER — Telehealth: Payer: BC Managed Care – PPO | Admitting: Physician Assistant

## 2020-11-05 DIAGNOSIS — U071 COVID-19: Secondary | ICD-10-CM

## 2020-11-05 NOTE — Progress Notes (Signed)
  E-Visit for [employer] Virus Screening  Based on what you have shared with me, you need to seek an evaluation for a severe illness that is causing your symptoms which may be coronavirus or some other illness. I recommend that you be seen and evaluated "face to face". If you are considered high risk for Corona virus because of a known exposure, fever, shortness of breath and cough, OR if you have severe symptoms of any kind, seek medical care at an emergency room. Our Emergency Departments are best equipped to handle patients with severe symptoms.  You will be evaluated by the ER provider (or higher level of care provider) who will determine whether you need formal testing.   I do believe it would be best to be seen in person to get the available anti-viral medications quickly. We do have a referral treatment team, but that would delay start of treatment for you, and unfortunately at this time we are not capable of prescribing the anti-virals at this time.   If you are having a true medical emergency please call 911.   I recommend the following:  . [facility] Emergency Department [address] [place], [place], [place] [address] [phone]  . [facility] Emergency Department [address] [place], [place] [address] [phone]  . [facility] [address] [facility] Emergency Department [address] [place], [place], [place] [address] [phone]  . [facility] Emergency Department [address] [place], [place], [place] [address] [phone]  . [facility] Emergency Department [address] [place], [place], [place] [address] [phone]  NOTE: If you entered your credit card information for this eVisit, you will not be charged. You may see a "hold" on your card for the $35 but that hold will drop off and you will not have a charge processed.   Your e-visit answers were reviewed by a board certified advanced clinical  practitioner to complete your personal care plan.  Thank you for using e-Visits.  I provided 5 minutes of non face-to-face time during this encounter for chart review and documentation.

## 2020-11-05 NOTE — Progress Notes (Signed)
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  Had submitted an evisit previously.   Will mark erroneous

## 2020-11-18 DIAGNOSIS — F4321 Adjustment disorder with depressed mood: Secondary | ICD-10-CM | POA: Diagnosis not present

## 2020-11-28 ENCOUNTER — Telehealth: Payer: BC Managed Care – PPO | Admitting: Physician Assistant

## 2020-11-28 DIAGNOSIS — J189 Pneumonia, unspecified organism: Secondary | ICD-10-CM | POA: Diagnosis not present

## 2020-11-28 MED ORDER — AZITHROMYCIN 250 MG PO TABS
ORAL_TABLET | ORAL | 0 refills | Status: DC
Start: 2020-11-28 — End: 2021-02-10

## 2020-11-28 MED ORDER — BENZONATATE 100 MG PO CAPS: 100.0000 mg | ORAL_CAPSULE | Freq: Three times a day (TID) | ORAL | 0 refills | Status: DC | PRN

## 2020-11-28 MED ORDER — PREDNISONE 10 MG (21) PO TBPK
ORAL_TABLET | ORAL | 0 refills | Status: DC
Start: 2020-11-28 — End: 2021-02-10

## 2020-11-28 NOTE — Progress Notes (Signed)
We are sorry that you are not feeling well.  Here is how we plan to help!  Based on your presentation I believe you most likely have A cough due to bacteria.  When patients have a fever and a productive cough with a change in color or increased sputum production, we are concerned about bacterial bronchitis.  If left untreated it can progress to pneumonia.  If your symptoms do not improve with your treatment plan it is important that you contact your provider.   I have prescribed Azithromyin 250 mg: two tablets now and then one tablet daily for 4 additonal days    In addition you may use A prescription cough medication called Tessalon Perles 100mg. You may take 1-2 capsules every 8 hours as needed for your cough.  Prednisone 10 mg daily for 6 days (see taper instructions below)  Directions for 6 day taper: Day 1: 2 tablets before breakfast, 1 after both lunch & dinner and 2 at bedtime Day 2: 1 tab before breakfast, 1 after both lunch & dinner and 2 at bedtime Day 3: 1 tab at each meal & 1 at bedtime Day 4: 1 tab at breakfast, 1 at lunch, 1 at bedtime Day 5: 1 tab at breakfast & 1 tab at bedtime Day 6: 1 tab at breakfast   From your responses in the eVisit questionnaire you describe inflammation in the upper respiratory tract which is causing a significant cough.  This is commonly called Bronchitis and has four common causes:    Allergies  Viral Infections  Acid Reflux  Bacterial Infection Allergies, viruses and acid reflux are treated by controlling symptoms or eliminating the cause. An example might be a cough caused by taking certain blood pressure medications. You stop the cough by changing the medication. Another example might be a cough caused by acid reflux. Controlling the reflux helps control the cough.  USE OF BRONCHODILATOR ("RESCUE") INHALERS: There is a risk from using your bronchodilator too frequently.  The risk is that over-reliance on a medication which only relaxes the  muscles surrounding the breathing tubes can reduce the effectiveness of medications prescribed to reduce swelling and congestion of the tubes themselves.  Although you feel brief relief from the bronchodilator inhaler, your asthma may actually be worsening with the tubes becoming more swollen and filled with mucus.  This can delay other crucial treatments, such as oral steroid medications. If you need to use a bronchodilator inhaler daily, several times per day, you should discuss this with your provider.  There are probably better treatments that could be used to keep your asthma under control.     HOME CARE . Only take medications as instructed by your medical team. . Complete the entire course of an antibiotic. . Drink plenty of fluids and get plenty of rest. . Avoid close contacts especially the very young and the elderly . Cover your mouth if you cough or cough into your sleeve. . Always remember to wash your hands . A steam or ultrasonic humidifier can help congestion.   GET HELP RIGHT AWAY IF: . You develop worsening fever. . You become short of breath . You cough up blood. . Your symptoms persist after you have completed your treatment plan MAKE SURE YOU   Understand these instructions.  Will watch your condition.  Will get help right away if you are not doing well or get worse.  Your e-visit answers were reviewed by a board certified advanced clinical practitioner to complete your   personal care plan.  Depending on the condition, your plan could have included both over the counter or prescription medications. If there is a problem please reply  once you have received a response from your provider. Your safety is important to us.  If you have drug allergies check your prescription carefully.    You can use MyChart to ask questions about today's visit, request a non-urgent call back, or ask for a work or school excuse for 24 hours related to this e-Visit. If it has been greater than  24 hours you will need to follow up with your provider, or enter a new e-Visit to address those concerns. You will get an e-mail in the next two days asking about your experience.  I hope that your e-visit has been valuable and will speed your recovery. Thank you for using e-visits.  I provided 5 minutes of non face-to-face time during this encounter for chart review and documentation.  

## 2021-02-07 ENCOUNTER — Telehealth: Payer: Self-pay | Admitting: Family Medicine

## 2021-02-07 ENCOUNTER — Other Ambulatory Visit: Payer: Self-pay | Admitting: Family Medicine

## 2021-02-07 MED ORDER — AMPHETAMINE-DEXTROAMPHETAMINE 20 MG PO TABS: ORAL_TABLET | ORAL | 0 refills | Status: DC

## 2021-02-07 MED ORDER — AMPHETAMINE-DEXTROAMPHETAMINE 20 MG PO TABS: 20.0000 mg | ORAL_TABLET | Freq: Two times a day (BID) | ORAL | 0 refills | Status: DC

## 2021-02-07 NOTE — Telephone Encounter (Signed)
Last filled 10/25/2020 Last OV 06/22/2020 (acute) Ok to fill?

## 2021-02-07 NOTE — Telephone Encounter (Signed)
Spoke with the pharmacy. They stated the prescription has already been filled and is ready for pick up.  Left a detailed message on verified voice mail informing the patient that the pharmacy has his prescription ready.

## 2021-02-07 NOTE — Telephone Encounter (Signed)
Patient wanted to know if someone could contact his pharmacy. [facility] [address] in [place].   Patient stated that "we just did this dance a couple months ago and your office had to call them again as to my Adderall prescription. They are stating that it can't be refilled, however, I haven't picked up a prescription in two months for it as I have been taking less of the medicine than typical.  Last time the result was that they had the prescription on file and just needed your office to initiate it as every time I have called, they state that it can't be filled when I know it can as there should not be a limitation issue as I haven't exceeded my prescription. "  Please advise.

## 2021-02-10 ENCOUNTER — Ambulatory Visit (INDEPENDENT_AMBULATORY_CARE_PROVIDER_SITE_OTHER): Payer: BC Managed Care – PPO | Admitting: Family Medicine

## 2021-02-10 ENCOUNTER — Encounter: Payer: Self-pay | Admitting: Family Medicine

## 2021-02-10 ENCOUNTER — Other Ambulatory Visit: Payer: Self-pay

## 2021-02-10 VITALS — BP 124/70 | HR 81 | Temp 97.5°F | Ht 68.0 in | Wt 233.6 lb

## 2021-02-10 DIAGNOSIS — Z0000 Encounter for general adult medical examination without abnormal findings: Secondary | ICD-10-CM | POA: Diagnosis not present

## 2021-02-10 DIAGNOSIS — Z1159 Encounter for screening for other viral diseases: Secondary | ICD-10-CM | POA: Diagnosis not present

## 2021-02-10 LAB — CBC WITH DIFFERENTIAL/PLATELET
Basophils Absolute: 0.1 10*3/uL (ref 0.0–0.1)
Basophils Relative: 1.2 % (ref 0.0–3.0)
Eosinophils Absolute: 0.3 10*3/uL (ref 0.0–0.7)
Eosinophils Relative: 6.4 % — ABNORMAL HIGH (ref 0.0–5.0)
HCT: 42.2 % (ref 39.0–52.0)
Hemoglobin: 14.2 g/dL (ref 13.0–17.0)
Lymphocytes Relative: 33.8 % (ref 12.0–46.0)
Lymphs Abs: 1.8 10*3/uL (ref 0.7–4.0)
MCHC: 33.6 g/dL (ref 30.0–36.0)
MCV: 90.8 fl (ref 78.0–100.0)
Monocytes Absolute: 0.5 10*3/uL (ref 0.1–1.0)
Monocytes Relative: 9.1 % (ref 3.0–12.0)
Neutro Abs: 2.6 10*3/uL (ref 1.4–7.7)
Neutrophils Relative %: 49.5 % (ref 43.0–77.0)
Platelets: 227 10*3/uL (ref 150.0–400.0)
RBC: 4.65 Mil/uL (ref 4.22–5.81)
RDW: 13.2 % (ref 11.5–15.5)
WBC: 5.2 10*3/uL (ref 4.0–10.5)

## 2021-02-10 LAB — HEPATIC FUNCTION PANEL
ALT: 10 U/L (ref 0–53)
AST: 15 U/L (ref 0–37)
Albumin: 4.4 g/dL (ref 3.5–5.2)
Alkaline Phosphatase: 39 U/L (ref 39–117)
Bilirubin, Direct: 0.2 mg/dL (ref 0.0–0.3)
Total Bilirubin: 0.7 mg/dL (ref 0.2–1.2)
Total Protein: 7.2 g/dL (ref 6.0–8.3)

## 2021-02-10 LAB — LIPID PANEL
Cholesterol: 151 mg/dL (ref 0–200)
HDL: 62.4 mg/dL (ref >39.00)
LDL Cholesterol: 75 mg/dL (ref 0–99)
NonHDL: 88.23
Total CHOL/HDL Ratio: 2
Triglycerides: 64 mg/dL (ref 0.0–149.0)
VLDL: 12.8 mg/dL (ref 0.0–40.0)

## 2021-02-10 LAB — BASIC METABOLIC PANEL
BUN: 12 mg/dL (ref 6–23)
CO2: 28 mEq/L (ref 19–32)
Calcium: 9.4 mg/dL (ref 8.4–10.5)
Chloride: 101 mEq/L (ref 96–112)
Creatinine, Ser: 0.71 mg/dL (ref 0.40–1.50)
GFR: 116.45 mL/min (ref >60.00)
Glucose, Bld: 76 mg/dL (ref 70–99)
Potassium: 4.2 mEq/L (ref 3.5–5.1)
Sodium: 138 mEq/L (ref 135–145)

## 2021-02-10 LAB — TSH: TSH: 1.04 u[IU]/mL (ref 0.35–5.50)

## 2021-02-10 MED ORDER — TRIAMCINOLONE ACETONIDE 0.1 % EX CREA: 1.0000 "application " | TOPICAL_CREAM | Freq: Two times a day (BID) | CUTANEOUS | 1 refills | Status: DC | PRN

## 2021-02-10 NOTE — Addendum Note (Signed)
Addended by: Kandra Nicolas on: 02/10/2021 01:55 PM   Modules accepted: Orders

## 2021-02-10 NOTE — Progress Notes (Signed)
Established Patient Office Visit  Subjective:  Patient ID: Jeffrey [name], male    DOB: 05/29/1981  Age: 40 y.o. MRN: [id number]  CC:  Chief Complaint  Patient presents with   Annual Exam    HPI Jeffrey [name] presents for physical exam.  He has hypertension which is treated with angiotensin receptor blocker.  He has ADD and takes Adderall for that.  He continues to exercise regularly at the gym 5 days/week.  No specific complaints at this time.  Health maintenance reviewed  -No history of hepatitis C screening.  Only risk is history of tattoos -Tetanus due 2026 -Has had COVID vaccines  Family history-his father had history of melanoma and prostate cancer.  He apparently died of sudden MI at age 76.  His father had hypertension.  His mother is alive and has no active medical problems.  No siblings.  He had several grandparents with history of heart disease  Social history-he is married and has 90-year-old daughter.  Never smoked.  No regular alcohol.  Past Medical History:  Diagnosis Date   Asthma     Past Surgical History:  Procedure Laterality Date   ANTERIOR CRUCIATE LIGAMENT REPAIR Right 2010   TONSILLECTOMY AND ADENOIDECTOMY     as a child    Family History  Problem Relation Age of Onset   Cirrhosis Mother    Cancer Father        prostate, melanoma   Heart disease Father 42       MI   Hypertension Father    Heart disease Maternal Grandmother    Heart disease Maternal Grandfather    Heart disease Paternal Grandmother    Heart disease Paternal Grandfather     Social History   Socioeconomic History   Marital status: Married    Spouse name: Not on file   Number of children: Not on file   Years of education: Not on file   Highest education level: Not on file  Occupational History   Not on file  Tobacco Use   Smoking status: Never   Smokeless tobacco: Never  Vaping Use   Vaping Use: Never used  Substance and Sexual Activity   Alcohol use: Yes     Comment: occasional   Drug use: No   Sexual activity: Not on file  Other Topics Concern   Not on file  Social History Narrative   Not on file   Social Determinants of Health   Financial Resource Strain: Not on file  Food Insecurity: Not on file  Transportation Needs: Not on file  Physical Activity: Not on file  Stress: Not on file  Social Connections: Not on file  Intimate Partner Violence: Not on file    Outpatient Medications Prior to Visit  Medication Sig Dispense Refill   albuterol (VENTOLIN HFA) 108 (90 Base) MCG/ACT inhaler INHALE 1-2 PUFFS INTO THE LUNGS EVERY 6 (SIX) HOURS AS NEEDED FOR WHEEZING OR SHORTNESS OF BREATH. 8.5 each 2   amphetamine-dextroamphetamine (ADDERALL) 20 MG tablet Take 1 tablet (20 mg total) by mouth 2 (two) times daily. 60 tablet 0   amphetamine-dextroamphetamine (ADDERALL) 20 MG tablet Take one tablet by mouth twice daily.  May refill in two months. 60 tablet 0   amphetamine-dextroamphetamine (ADDERALL) 20 MG tablet Take one tablet by mouth twice daily.   May refill in one month. 60 tablet 0   augmented betamethasone dipropionate (DIPROLENE-AF) 0.05 % ointment Apply topically 2 (two) times daily. 15 g 1   escitalopram (LEXAPRO) 10  MG tablet TAKE 1 TABLET BY MOUTH EVERY DAY 90 tablet 0   Multiple Vitamins-Minerals (MENS MULTI VITAMIN & MINERAL PO) Take by mouth daily.     valsartan (DIOVAN) 80 MG tablet TAKE 1 TABLET BY MOUTH EVERY DAY 90 tablet 3   azithromycin (ZITHROMAX) 250 MG tablet Take 2 tablets PO on day one, and one tablet PO daily thereafter until completed. 6 tablet 0   benzonatate (TESSALON) 100 MG capsule Take 1 capsule (100 mg total) by mouth 3 (three) times daily as needed. 30 capsule 0   cyclobenzaprine (FLEXERIL) 10 MG tablet Take 1 tablet (10 mg total) by mouth 3 (three) times daily as needed for muscle spasms. 30 tablet 0   Desoximetasone (TOPICORT) 0.25 % ointment Apply 1 application topically 2 (two) times daily as needed. 30 g 2    naproxen (NAPROSYN) 375 MG tablet Take 1 tablet (375 mg total) by mouth 2 (two) times daily. 20 tablet 0   naproxen sodium (ANAPROX) 220 MG tablet Take 220 mg by mouth 2 (two) times daily with a meal.     predniSONE (STERAPRED UNI-PAK 21 TAB) 10 MG (21) TBPK tablet 6 day taper; take as directed on package instructions 21 tablet 0   tamsulosin (FLOMAX) 0.4 MG CAPS capsule Take 1 capsule (0.4 mg total) by mouth daily. 20 capsule 0   triamcinolone cream (KENALOG) 0.1 % Apply 1 application topically 2 (two) times daily as needed. 453 g 1   No facility-administered medications prior to visit.    Allergies  Allergen Reactions   2,4-D Dimethylamine (Amisol) Hives   Phenergan [Promethazine Hcl] Nausea And Vomiting    ROS Review of Systems  Constitutional:  Negative for activity change, appetite change, fatigue and fever.  HENT:  Negative for congestion, ear pain and trouble swallowing.   Eyes:  Negative for pain and visual disturbance.  Respiratory:  Negative for cough, shortness of breath and wheezing.   Cardiovascular:  Negative for chest pain and palpitations.  Gastrointestinal:  Negative for abdominal distention, abdominal pain, blood in stool, constipation, diarrhea, nausea, rectal pain and vomiting.  Genitourinary:  Negative for dysuria, hematuria and testicular pain.  Musculoskeletal:  Negative for arthralgias and joint swelling.  Skin:  Negative for rash.  Neurological:  Negative for dizziness, syncope and headaches.  Hematological:  Negative for adenopathy.  Psychiatric/Behavioral:  Negative for confusion and dysphoric mood.      Objective:    Physical Exam Constitutional:      General: He is not in acute distress.    Appearance: Normal appearance. He is well-developed.  HENT:     Head: Normocephalic and atraumatic.     Right Ear: External ear normal.     Left Ear: External ear normal.  Eyes:     Pupils: Pupils are equal, round, and reactive to light.  Neck:     Thyroid:  No thyromegaly.  Cardiovascular:     Rate and Rhythm: Normal rate and regular rhythm.     Heart sounds: Normal heart sounds. No murmur heard. Pulmonary:     Effort: No respiratory distress.     Breath sounds: No wheezing or rales.  Abdominal:     General: Bowel sounds are normal. There is no distension.     Palpations: Abdomen is soft. There is no mass.     Tenderness: There is no abdominal tenderness. There is no guarding or rebound.  Musculoskeletal:     Cervical back: Normal range of motion and neck supple.  Right lower leg: No edema.     Left lower leg: No edema.  Lymphadenopathy:     Cervical: No cervical adenopathy.  Skin:    Findings: Rash present.     Comments: He has some scaly eczematous rash on both feet which has been chronic and intermittent.  Neurological:     Mental Status: He is alert and oriented to person, place, and time.     Cranial Nerves: No cranial nerve deficit.    BP 124/70 (BP Location: Left Arm, Patient Position: Sitting, Cuff Size: Normal)   Pulse 81   Temp (!) 97.5 F (36.4 C) (Oral)   Ht 5\' 8"  (1.727 m)   Wt 233 lb 9.6 oz (106 kg)   SpO2 98%   BMI 35.52 kg/m  Wt Readings from Last 3 Encounters:  02/10/21 233 lb 9.6 oz (106 kg)  11/26/18 211 lb 14.4 oz (96.1 kg)  07/11/18 215 lb 14.4 oz (97.9 kg)     Health Maintenance Due  Topic Date Due   Pneumococcal Vaccine 2-36 Years old (1 - PCV) Never done   HIV Screening  Never done   Hepatitis C Screening  Never done   COVID-19 Vaccine (3 - Pfizer risk series) 09/16/2019   INFLUENZA VACCINE  01/16/2021    There are no preventive care reminders to display for this patient.  Lab Results  Component Value Date   TSH 1.13 07/11/2018   Lab Results  Component Value Date   WBC 5.3 07/11/2018   HGB 14.7 07/11/2018   HCT 43.6 07/11/2018   MCV 90.3 07/11/2018   PLT 225.0 07/11/2018   Lab Results  Component Value Date   NA 140 01/04/2018   K 4.0 01/04/2018   CO2 29 01/04/2018   GLUCOSE  91 01/04/2018   BUN 12 01/04/2018   CREATININE 0.68 01/04/2018   BILITOT 0.7 12/03/2016   ALKPHOS 42 12/03/2016   AST 13 12/03/2016   ALT 9 12/03/2016   PROT 6.3 12/03/2016   ALBUMIN 4.2 12/03/2016   CALCIUM 8.6 (L) 01/04/2018   ANIONGAP 7 01/04/2018   GFR 151.93 12/03/2016   Lab Results  Component Value Date   CHOL 118 12/03/2016   Lab Results  Component Value Date   HDL 49.10 12/03/2016   Lab Results  Component Value Date   LDLCALC 54 12/03/2016   Lab Results  Component Value Date   TRIG 73.0 12/03/2016   Lab Results  Component Value Date   CHOLHDL 2 12/03/2016   No results found for: HGBA1C    Assessment & Plan:   Problem List Items Addressed This Visit   None Visit Diagnoses     Physical exam    -  Primary   Relevant Orders   Basic metabolic panel   Lipid panel   CBC with Differential/Platelet   TSH   Hepatic function panel   Hep C Antibody     History of hypertension currently well controlled by today's exam.  -Continue regular exercise habits and try to get his weight back down. -Screening labs as above including hepatitis C antibody -We did discuss possible coronary calcium scan by age 36 to further risk stratify with his strong family history of CAD. -Refill triamcinolone cream for as needed use  Meds ordered this encounter  Medications   triamcinolone cream (KENALOG) 0.1 %    Sig: Apply 1 application topically 2 (two) times daily as needed.    Dispense:  453 g    Refill:  1  Follow-up: No follow-ups on file.    Carolann Littler, MD

## 2021-02-13 LAB — HEPATITIS C ANTIBODY
Hepatitis C Ab: NONREACTIVE
SIGNAL TO CUT-OFF: 0.02 (ref <1.00)

## 2021-02-17 ENCOUNTER — Other Ambulatory Visit: Payer: Self-pay | Admitting: Family Medicine

## 2021-03-10 DIAGNOSIS — F4321 Adjustment disorder with depressed mood: Secondary | ICD-10-CM | POA: Diagnosis not present

## 2021-04-07 DIAGNOSIS — F4321 Adjustment disorder with depressed mood: Secondary | ICD-10-CM | POA: Diagnosis not present

## 2021-04-12 ENCOUNTER — Telehealth: Payer: BC Managed Care – PPO | Admitting: Nurse Practitioner

## 2021-04-12 DIAGNOSIS — J029 Acute pharyngitis, unspecified: Secondary | ICD-10-CM | POA: Diagnosis not present

## 2021-04-12 DIAGNOSIS — Z20818 Contact with and (suspected) exposure to other bacterial communicable diseases: Secondary | ICD-10-CM

## 2021-04-12 MED ORDER — AMOXICILLIN 500 MG PO CAPS: 500.0000 mg | ORAL_CAPSULE | Freq: Three times a day (TID) | ORAL | 0 refills | Status: AC

## 2021-04-12 NOTE — Progress Notes (Signed)
E-Visit for Sore Throat - Strep Symptoms ° °We are sorry that you are not feeling well.  Here is how we plan to help! ° °Based on what you have shared with me it is likely that you have strep pharyngitis.  Strep pharyngitis is inflammation and infection in the back of the throat.  This is an infection cause by bacteria and is treated with antibiotics.  I have prescribed Amoxicillin 500 mg twice a day for 10 days. For throat pain, we recommend over the counter oral pain relief medications such as acetaminophen or aspirin, or anti-inflammatory medications such as ibuprofen or naproxen sodium. Topical treatments such as oral throat lozenges or sprays may be used as needed. Strep infections are not as easily transmitted as other respiratory infections, however we still recommend that you avoid close contact with loved ones, especially the very young and elderly.  Remember to wash your hands thoroughly throughout the day as this is the number one way to prevent the spread of infection and wipe down door knobs and counters with disinfectant. ° ° °Home Care: °Only take medications as instructed by your medical team. °Complete the entire course of an antibiotic. °Do not take these medications with alcohol. °A steam or ultrasonic humidifier can help congestion.  You can place a towel over your head and breathe in the steam from hot water coming from a faucet. °Avoid close contacts especially the very young and the elderly. °Cover your mouth when you cough or sneeze. °Always remember to wash your hands. ° °Get Help Right Away If: °You develop worsening fever or sinus pain. °You develop a severe head ache or visual changes. °Your symptoms persist after you have completed your treatment plan. ° °Make sure you °Understand these instructions. °Will watch your condition. °Will get help right away if you are not doing well or get worse. ° ° °Thank you for choosing an e-visit. ° °Your e-visit answers were reviewed by a board  certified advanced clinical practitioner to complete your personal care plan. Depending upon the condition, your plan could have included both over the counter or prescription medications. ° °Please review your pharmacy choice. Make sure the pharmacy is open so you can pick up prescription now. If there is a problem, you may contact your provider through MyChart messaging and have the prescription routed to another pharmacy.  Your safety is important to us. If you have drug allergies check your prescription carefully.  ° °For the next 24 hours you can use MyChart to ask questions about today's visit, request a non-urgent call back, or ask for a work or school excuse. °You will get an email in the next two days asking about your experience. I hope that your e-visit has been valuable and will speed your recovery. ° °5-10 minutes spent reviewing and documenting in chart. ° °

## 2021-05-05 ENCOUNTER — Ambulatory Visit: Payer: BC Managed Care – PPO | Admitting: Family Medicine

## 2021-05-05 VITALS — BP 115/68 | HR 80 | Temp 97.5°F | Wt 233.8 lb

## 2021-05-05 DIAGNOSIS — R109 Unspecified abdominal pain: Secondary | ICD-10-CM | POA: Diagnosis not present

## 2021-05-05 DIAGNOSIS — F4321 Adjustment disorder with depressed mood: Secondary | ICD-10-CM | POA: Diagnosis not present

## 2021-05-05 NOTE — Progress Notes (Signed)
Established Patient Office Visit  Subjective:  Patient ID: Jeffrey [name], male    DOB: 1980-09-20  Age: 40 y.o. MRN: [id number]  CC:  Chief Complaint  Patient presents with   Flank Pain    X 2 weeks, L side, started after going to the gym    HPI Jeffrey [name] presents for pain in his left lateral and lower rib region.  Couple weeks ago was at the gym doing leg presses and weights came back rather quickly on the one episode and he felt a popping sensation left lower rib cage area.  He had some pain with movement and things like sit ups and occasionally at night lying supine.  No visible swelling or bruising.  Still able to run.  Ran 4 miles yesterday.  No pleuritic pain.  No cough.  Past Medical History:  Diagnosis Date   Asthma     Past Surgical History:  Procedure Laterality Date   ANTERIOR CRUCIATE LIGAMENT REPAIR Right 2010   TONSILLECTOMY AND ADENOIDECTOMY     as a child    Family History  Problem Relation Age of Onset   Cirrhosis Mother    Cancer Father        prostate, melanoma   Heart disease Father 26       MI   Hypertension Father    Heart disease Maternal Grandmother    Heart disease Maternal Grandfather    Heart disease Paternal Grandmother    Heart disease Paternal Grandfather     Social History   Socioeconomic History   Marital status: Married    Spouse name: Not on file   Number of children: Not on file   Years of education: Not on file   Highest education level: Not on file  Occupational History   Not on file  Tobacco Use   Smoking status: Never   Smokeless tobacco: Never  Vaping Use   Vaping Use: Never used  Substance and Sexual Activity   Alcohol use: Yes    Comment: occasional   Drug use: No   Sexual activity: Not on file  Other Topics Concern   Not on file  Social History Narrative   Not on file   Social Determinants of Health   Financial Resource Strain: Not on file  Food Insecurity: Not on file  Transportation Needs: Not  on file  Physical Activity: Not on file  Stress: Not on file  Social Connections: Not on file  Intimate Partner Violence: Not on file    Outpatient Medications Prior to Visit  Medication Sig Dispense Refill   albuterol (VENTOLIN HFA) 108 (90 Base) MCG/ACT inhaler INHALE 1-2 PUFFS INTO THE LUNGS EVERY 6 (SIX) HOURS AS NEEDED FOR WHEEZING OR SHORTNESS OF BREATH. 8.5 each 2   amphetamine-dextroamphetamine (ADDERALL) 20 MG tablet Take 1 tablet (20 mg total) by mouth 2 (two) times daily. 60 tablet 0   amphetamine-dextroamphetamine (ADDERALL) 20 MG tablet Take one tablet by mouth twice daily.  May refill in two months. 60 tablet 0   amphetamine-dextroamphetamine (ADDERALL) 20 MG tablet Take one tablet by mouth twice daily.   May refill in one month. 60 tablet 0   augmented betamethasone dipropionate (DIPROLENE-AF) 0.05 % ointment Apply topically 2 (two) times daily. 15 g 1   escitalopram (LEXAPRO) 10 MG tablet TAKE 1 TABLET BY MOUTH EVERY DAY 90 tablet 2   Multiple Vitamins-Minerals (MENS MULTI VITAMIN & MINERAL PO) Take by mouth daily.     triamcinolone cream (KENALOG) 0.1 %  Apply 1 application topically 2 (two) times daily as needed. 453 g 1   valsartan (DIOVAN) 80 MG tablet TAKE 1 TABLET BY MOUTH EVERY DAY 90 tablet 3   No facility-administered medications prior to visit.    Allergies  Allergen Reactions   2,4-D Dimethylamine (Amisol) Hives   Phenergan [Promethazine Hcl] Nausea And Vomiting    ROS Review of Systems  Respiratory:  Negative for cough and shortness of breath.   Cardiovascular:  Negative for chest pain.     Objective:    Physical Exam Vitals reviewed.  Cardiovascular:     Rate and Rhythm: Normal rate and regular rhythm.  Pulmonary:     Effort: Pulmonary effort is normal.     Breath sounds: Normal breath sounds.  Abdominal:     Palpations: Abdomen is soft.     Comments: No splenomegaly.  Musculoskeletal:     Comments: He has some mild tenderness to palpation  left lower lateral rib cage area.  Not very well localized.  No visible bruising or swelling.  Neurological:     Mental Status: He is alert.    BP 115/68 (BP Location: Left Arm, Cuff Size: Normal)   Pulse 80   Temp (!) 97.5 F (36.4 C) (Oral)   Wt 233 lb 12.8 oz (106.1 kg)   SpO2 98%   BMI 35.55 kg/m  Wt Readings from Last 3 Encounters:  05/05/21 233 lb 12.8 oz (106.1 kg)  02/10/21 233 lb 9.6 oz (106 kg)  11/26/18 211 lb 14.4 oz (96.1 kg)     Health Maintenance Due  Topic Date Due   Pneumococcal Vaccine 1-33 Years old (1 - PCV) Never done   HIV Screening  Never done   INFLUENZA VACCINE  01/16/2021    There are no preventive care reminders to display for this patient.  Lab Results  Component Value Date   TSH 1.04 02/10/2021   Lab Results  Component Value Date   WBC 5.2 02/10/2021   HGB 14.2 02/10/2021   HCT 42.2 02/10/2021   MCV 90.8 02/10/2021   PLT 227.0 02/10/2021   Lab Results  Component Value Date   NA 138 02/10/2021   K 4.2 02/10/2021   CO2 28 02/10/2021   GLUCOSE 76 02/10/2021   BUN 12 02/10/2021   CREATININE 0.71 02/10/2021   BILITOT 0.7 02/10/2021   ALKPHOS 39 02/10/2021   AST 15 02/10/2021   ALT 10 02/10/2021   PROT 7.2 02/10/2021   ALBUMIN 4.4 02/10/2021   CALCIUM 9.4 02/10/2021   ANIONGAP 7 01/04/2018   GFR 116.45 02/10/2021   Lab Results  Component Value Date   CHOL 151 02/10/2021   Lab Results  Component Value Date   HDL 62.40 02/10/2021   Lab Results  Component Value Date   LDLCALC 75 02/10/2021   Lab Results  Component Value Date   TRIG 64.0 02/10/2021   Lab Results  Component Value Date   CHOLHDL 2 02/10/2021   No results found for: HGBA1C    Assessment & Plan:   Left lower abdominal/rib cage pain.  Question oblique strain versus intercostal muscle injury. -Continue other exercise as tolerated -Over-the-counter anti-inflammatories as needed -Avoid sit ups or other abdominal exercises that reproduce pain until  further improved.  Suspect this should improve over the next few weeks.  No orders of the defined types were placed in this encounter.   Follow-up: No follow-ups on file.    Evelena Peat, MD

## 2021-05-16 ENCOUNTER — Other Ambulatory Visit: Payer: Self-pay | Admitting: Family Medicine

## 2021-05-16 MED ORDER — AMPHETAMINE-DEXTROAMPHETAMINE 20 MG PO TABS: ORAL_TABLET | ORAL | 0 refills | Status: DC

## 2021-05-16 MED ORDER — AMPHETAMINE-DEXTROAMPHETAMINE 20 MG PO TABS: 20.0000 mg | ORAL_TABLET | Freq: Two times a day (BID) | ORAL | 0 refills | Status: DC

## 2021-05-16 NOTE — Telephone Encounter (Signed)
Last filled 02/07/2021 Last OV 05/05/2021  Ok to fill?

## 2021-05-27 ENCOUNTER — Other Ambulatory Visit: Payer: Self-pay | Admitting: Family Medicine

## 2021-06-12 ENCOUNTER — Encounter: Payer: Self-pay | Admitting: Family Medicine

## 2021-06-13 ENCOUNTER — Telehealth: Payer: Self-pay | Admitting: Family Medicine

## 2021-06-13 NOTE — Telephone Encounter (Signed)
Patient calling in with respiratory symptoms: Shortness of breath, chest pain, palpitations or other red words send to Triage  Does the patient have a fever over 100, cough, congestion, sore throat, runny nose, lost of taste/smell (please list symptoms that patient has)? Weaken immune system  What date did symptoms start?06-10-2021 (If over 5 days ago, pt may be scheduled for in person visit)  Have you tested for Covid in the last 5 days? Yes   If yes, was it positive []  OR negative [] ? If positive in the last 5 days, please schedule virtual visit now. If negative, schedule for an in person OV with the next available provider if PCP has no openings. Please also let patient know they will be tested again (follow the script below)  "you will have to arrive prior to your appt time to be Covid tested. Please park in back of office at the [facility] & call [phone] to let the staff know you have arrived. A staff member will meet you at your car to do a rapid covid test. Once the test has resulted you will be notified by phone of your results to determine if appt will remain an in person visit or be converted to a virtual/phone visit. If you arrive less than before your appt time, your visit will be automatically converted to virtual & any recommended testing will happen AFTER the visit." Pt has virtual with dr [phone] on 06-14-2021 at 845 am  THINGS TO REMEMBER  If no availability for virtual visit in office,  please schedule another [place] office  If no availability at another [place] office, please instruct patient that they can schedule an evisit or virtual visit through their mychart account. Visits up to 8pm  patients can be seen in office 5 days after positive COVID test

## 2021-06-14 ENCOUNTER — Telehealth (INDEPENDENT_AMBULATORY_CARE_PROVIDER_SITE_OTHER): Payer: BC Managed Care – PPO | Admitting: Family Medicine

## 2021-06-14 ENCOUNTER — Encounter: Payer: Self-pay | Admitting: Family Medicine

## 2021-06-14 VITALS — HR 90 | Ht 68.0 in | Wt 229.0 lb

## 2021-06-14 DIAGNOSIS — U071 COVID-19: Secondary | ICD-10-CM

## 2021-06-14 MED ORDER — ALBUTEROL SULFATE HFA 108 (90 BASE) MCG/ACT IN AERS: 1.0000 | INHALATION_SPRAY | Freq: Four times a day (QID) | RESPIRATORY_TRACT | 2 refills | Status: DC | PRN

## 2021-06-14 NOTE — Progress Notes (Signed)
Patient ID: Jeffrey [name], male   DOB: 1981-04-22, 40 y.o.   MRN: [id number]  This visit type was conducted due to national recommendations for restrictions regarding the COVID-19 pandemic in an effort to limit this patient's exposure and mitigate transmission in our community.   Virtual Visit via Video Note  I connected with Jeffrey [name] on 06/14/21 at  8:45 AM EST by a video enabled telemedicine application and verified that I am speaking with the correct person using two identifiers.  Location patient: home Location provider:work or home office Persons participating in the virtual visit: patient, provider  I discussed the limitations of evaluation and management by telemedicine and the availability of in person appointments. The patient expressed understanding and agreed to proceed.   HPI:  Jeffrey [name] has COVID-19.  His daughter had developed some respiratory symptoms last week.  They took her into pediatrician on Saturday but she came back COVID-negative.  Jeffrey [name] tested positive for COVID on Saturday by home test.  This would make day 5.  His symptoms are relatively mild.  Minimal cough.  Some nasal congestion.  Less fatigued than previous episode of COVID.  He feels that he is about 70% better at this time.  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  No chronic lung disease other than intermittent asthma.  Non-smoker.  No nausea, vomiting, or diarrhea.   ROS: See pertinent positives and negatives per HPI.  Past Medical History:  Diagnosis Date   Asthma     Past Surgical History:  Procedure Laterality Date   ANTERIOR CRUCIATE LIGAMENT REPAIR Right 2010   TONSILLECTOMY AND ADENOIDECTOMY     as a child    Family History  Problem Relation Age of Onset   Cirrhosis Mother    Cancer Father        prostate, melanoma   Heart disease Father 86       MI   Hypertension Father    Heart disease Maternal Grandmother    Heart disease Maternal Grandfather    Heart disease Paternal  Grandmother    Heart disease Paternal Grandfather     SOCIAL HX: Non-smoker   Current Outpatient Medications:    amphetamine-dextroamphetamine (ADDERALL) 20 MG tablet, Take 1 tablet (20 mg total) by mouth 2 (two) times daily., Disp: 60 tablet, Rfl: 0   amphetamine-dextroamphetamine (ADDERALL) 20 MG tablet, Take one tablet by mouth twice daily.  May refill in two months., Disp: 60 tablet, Rfl: 0   amphetamine-dextroamphetamine (ADDERALL) 20 MG tablet, Take one tablet by mouth twice daily.   May refill in one month., Disp: 60 tablet, Rfl: 0   augmented betamethasone dipropionate (DIPROLENE-AF) 0.05 % ointment, Apply topically 2 (two) times daily., Disp: 15 g, Rfl: 1   escitalopram (LEXAPRO) 10 MG tablet, TAKE 1 TABLET BY MOUTH EVERY DAY, Disp: 90 tablet, Rfl: 2   Multiple Vitamins-Minerals (MENS MULTI VITAMIN & MINERAL PO), Take by mouth daily., Disp: , Rfl:    triamcinolone cream (KENALOG) 0.1 %, Apply 1 application topically 2 (two) times daily as needed., Disp: 453 g, Rfl: 1   valsartan (DIOVAN) 80 MG tablet, TAKE 1 TABLET BY MOUTH EVERY DAY, Disp: 90 tablet, Rfl: 3   albuterol (VENTOLIN HFA) 108 (90 Base) MCG/ACT inhaler, Inhale 1-2 puffs into the lungs every 6 (six) hours as needed for wheezing or shortness of breath., Disp: 8.5 each, Rfl: 2  EXAM:  VITALS per patient if applicable:  GENERAL: alert, oriented, appears well and in no acute distress  HEENT: atraumatic, conjunttiva  clear, no obvious abnormalities on inspection of external nose and ears  NECK: normal movements of the head and neck  LUNGS: on inspection no signs of respiratory distress, breathing rate appears normal, no obvious gross SOB, gasping or wheezing  CV: no obvious cyanosis  MS: moves all visible extremities without noticeable abnormality  PSYCH/NEURO: pleasant and cooperative, no obvious depression or anxiety, speech and thought processing grossly intact  ASSESSMENT AND PLAN:  Discussed the following  assessment and plan:  COVID-he is on day 5 and improving with about 70% improvement thus far.  No respiratory distress.  We did discuss antiviral therapies but since he is near end of 5-day window and significantly improved we decided to observe for now.  Continue with good hydration and plenty of rest.  Follow-up promptly for any fever or any worsening symptoms He will continue over-the-counter cough medicines as needed     I discussed the assessment and treatment plan with the patient. The patient was provided an opportunity to ask questions and all were answered. The patient agreed with the plan and demonstrated an understanding of the instructions.   The patient was advised to call back or seek an in-person evaluation if the symptoms worsen or if the condition fails to improve as anticipated.     Evelena Peat, MD

## 2021-06-29 ENCOUNTER — Telehealth: Payer: BC Managed Care – PPO | Admitting: Physician Assistant

## 2021-06-29 DIAGNOSIS — J208 Acute bronchitis due to other specified organisms: Secondary | ICD-10-CM | POA: Diagnosis not present

## 2021-06-29 DIAGNOSIS — B9689 Other specified bacterial agents as the cause of diseases classified elsewhere: Secondary | ICD-10-CM | POA: Diagnosis not present

## 2021-06-29 MED ORDER — PREDNISONE 10 MG (21) PO TBPK
ORAL_TABLET | ORAL | 0 refills | Status: DC
Start: 2021-06-29 — End: 2023-08-30

## 2021-06-29 MED ORDER — AZITHROMYCIN 250 MG PO TABS: ORAL_TABLET | ORAL | 0 refills | Status: AC

## 2021-06-29 MED ORDER — BENZONATATE 100 MG PO CAPS
100.0000 mg | ORAL_CAPSULE | Freq: Three times a day (TID) | ORAL | 0 refills | Status: DC | PRN
Start: 2021-06-29 — End: 2023-08-30

## 2021-06-29 NOTE — Progress Notes (Signed)

## 2021-06-30 DIAGNOSIS — F4321 Adjustment disorder with depressed mood: Secondary | ICD-10-CM | POA: Diagnosis not present

## 2021-08-04 DIAGNOSIS — F4321 Adjustment disorder with depressed mood: Secondary | ICD-10-CM | POA: Diagnosis not present

## 2021-09-06 ENCOUNTER — Telehealth: Payer: Self-pay | Admitting: Family Medicine

## 2021-09-06 ENCOUNTER — Other Ambulatory Visit: Payer: Self-pay | Admitting: Family Medicine

## 2021-09-06 MED ORDER — AMPHETAMINE-DEXTROAMPHETAMINE 20 MG PO TABS: ORAL_TABLET | ORAL | 0 refills | Status: DC

## 2021-09-06 MED ORDER — AMPHETAMINE-DEXTROAMPHETAMINE 20 MG PO TABS: 20.0000 mg | ORAL_TABLET | Freq: Two times a day (BID) | ORAL | 0 refills | Status: DC

## 2021-09-06 NOTE — Telephone Encounter (Signed)
Pt has sent mychart message and would like a refill on amphetamine-dextroamphetamine (ADDERALL) 20 MG tablet  ?[id number] [address], [place] [address] Phone:  [phone]  ?Fax:  [phone]  ?  ? ?

## 2021-09-06 NOTE — Telephone Encounter (Signed)
Left a detailed message informing pt that rx has already been sent on 09/06/2021. ?

## 2021-09-15 DIAGNOSIS — F4321 Adjustment disorder with depressed mood: Secondary | ICD-10-CM | POA: Diagnosis not present

## 2021-10-03 ENCOUNTER — Other Ambulatory Visit: Payer: Self-pay | Admitting: Family Medicine

## 2021-10-06 DIAGNOSIS — F4321 Adjustment disorder with depressed mood: Secondary | ICD-10-CM | POA: Diagnosis not present

## 2021-11-10 DIAGNOSIS — F4321 Adjustment disorder with depressed mood: Secondary | ICD-10-CM | POA: Diagnosis not present

## 2021-11-26 ENCOUNTER — Other Ambulatory Visit: Payer: Self-pay | Admitting: Family Medicine

## 2021-12-15 DIAGNOSIS — F4321 Adjustment disorder with depressed mood: Secondary | ICD-10-CM | POA: Diagnosis not present

## 2021-12-22 ENCOUNTER — Other Ambulatory Visit: Payer: Self-pay | Admitting: Family Medicine

## 2021-12-25 MED ORDER — AMPHETAMINE-DEXTROAMPHETAMINE 20 MG PO TABS: 20.0000 mg | ORAL_TABLET | Freq: Two times a day (BID) | ORAL | 0 refills | Status: DC

## 2021-12-25 MED ORDER — AMPHETAMINE-DEXTROAMPHETAMINE 20 MG PO TABS: ORAL_TABLET | ORAL | 0 refills | Status: DC

## 2022-01-12 DIAGNOSIS — F4321 Adjustment disorder with depressed mood: Secondary | ICD-10-CM | POA: Diagnosis not present

## 2022-02-24 ENCOUNTER — Other Ambulatory Visit: Payer: Self-pay | Admitting: Family Medicine

## 2022-03-16 DIAGNOSIS — F4321 Adjustment disorder with depressed mood: Secondary | ICD-10-CM | POA: Diagnosis not present

## 2022-03-19 ENCOUNTER — Encounter: Payer: Self-pay | Admitting: Family Medicine

## 2022-03-19 MED ORDER — TRIAMCINOLONE ACETONIDE 0.1 % EX CREA: 1.0000 | TOPICAL_CREAM | Freq: Two times a day (BID) | CUTANEOUS | 1 refills | Status: DC | PRN

## 2022-04-04 ENCOUNTER — Other Ambulatory Visit: Payer: Self-pay | Admitting: Family Medicine

## 2022-04-04 MED ORDER — AMPHETAMINE-DEXTROAMPHETAMINE 20 MG PO TABS: 20.0000 mg | ORAL_TABLET | Freq: Two times a day (BID) | ORAL | 0 refills | Status: DC

## 2022-04-04 NOTE — Telephone Encounter (Signed)
Last VV-06/14/2021 Last refill-12/25/21--60 tabs, 2 refills  No future OV scheduled.

## 2022-04-04 NOTE — Telephone Encounter (Signed)
Needs office follow-up soon.  Refilled once.  Jeffrey Post MD [facility] at [facility]

## 2022-04-13 DIAGNOSIS — F4321 Adjustment disorder with depressed mood: Secondary | ICD-10-CM | POA: Diagnosis not present

## 2022-05-23 ENCOUNTER — Encounter: Payer: Self-pay | Admitting: Family Medicine

## 2022-05-23 ENCOUNTER — Ambulatory Visit (INDEPENDENT_AMBULATORY_CARE_PROVIDER_SITE_OTHER): Payer: BC Managed Care – PPO | Admitting: Family Medicine

## 2022-05-23 VITALS — BP 118/70 | HR 76 | Temp 97.9°F | Ht 70.08 in | Wt 232.3 lb

## 2022-05-23 DIAGNOSIS — Z0000 Encounter for general adult medical examination without abnormal findings: Secondary | ICD-10-CM

## 2022-05-23 DIAGNOSIS — Z8249 Family history of ischemic heart disease and other diseases of the circulatory system: Secondary | ICD-10-CM | POA: Diagnosis not present

## 2022-05-23 LAB — CBC WITH DIFFERENTIAL/PLATELET
Basophils Absolute: 0.1 10*3/uL (ref 0.0–0.1)
Basophils Relative: 1 % (ref 0.0–3.0)
Eosinophils Absolute: 0.7 10*3/uL (ref 0.0–0.7)
Eosinophils Relative: 9.6 % — ABNORMAL HIGH (ref 0.0–5.0)
HCT: 44.3 % (ref 39.0–52.0)
Hemoglobin: 15.2 g/dL (ref 13.0–17.0)
Lymphocytes Relative: 30.7 % (ref 12.0–46.0)
Lymphs Abs: 2.2 10*3/uL (ref 0.7–4.0)
MCHC: 34.4 g/dL (ref 30.0–36.0)
MCV: 90.9 fl (ref 78.0–100.0)
Monocytes Absolute: 0.7 10*3/uL (ref 0.1–1.0)
Monocytes Relative: 10.2 % (ref 3.0–12.0)
Neutro Abs: 3.5 10*3/uL (ref 1.4–7.7)
Neutrophils Relative %: 48.5 % (ref 43.0–77.0)
Platelets: 236 10*3/uL (ref 150.0–400.0)
RBC: 4.87 Mil/uL (ref 4.22–5.81)
RDW: 13.3 % (ref 11.5–15.5)
WBC: 7.2 10*3/uL (ref 4.0–10.5)

## 2022-05-23 LAB — HEPATIC FUNCTION PANEL
ALT: 12 U/L (ref 0–53)
AST: 17 U/L (ref 0–37)
Albumin: 4.7 g/dL (ref 3.5–5.2)
Alkaline Phosphatase: 43 U/L (ref 39–117)
Bilirubin, Direct: 0.1 mg/dL (ref 0.0–0.3)
Total Bilirubin: 0.6 mg/dL (ref 0.2–1.2)
Total Protein: 7.6 g/dL (ref 6.0–8.3)

## 2022-05-23 LAB — LIPID PANEL
Cholesterol: 150 mg/dL (ref 0–200)
HDL: 64.8 mg/dL (ref >39.00)
LDL Cholesterol: 69 mg/dL (ref 0–99)
NonHDL: 85.18
Total CHOL/HDL Ratio: 2
Triglycerides: 82 mg/dL (ref 0.0–149.0)
VLDL: 16.4 mg/dL (ref 0.0–40.0)

## 2022-05-23 LAB — BASIC METABOLIC PANEL
BUN: 24 mg/dL — ABNORMAL HIGH (ref 6–23)
CO2: 31 mEq/L (ref 19–32)
Calcium: 9.6 mg/dL (ref 8.4–10.5)
Chloride: 101 mEq/L (ref 96–112)
Creatinine, Ser: 0.84 mg/dL (ref 0.40–1.50)
GFR: 109.69 mL/min (ref >60.00)
Glucose, Bld: 90 mg/dL (ref 70–99)
Potassium: 4.4 mEq/L (ref 3.5–5.1)
Sodium: 138 mEq/L (ref 135–145)

## 2022-05-23 LAB — TSH: TSH: 1.3 u[IU]/mL (ref 0.35–5.50)

## 2022-05-23 NOTE — Progress Notes (Signed)
Established Patient Office Visit  Subjective   Patient ID: Jeffrey [name], male    DOB: May 15, 1981  Age: 41 y.o. MRN: [id number]  Chief Complaint  Patient presents with   Annual Exam    HPI   Perrion is seen for complete physical.  He has history of hypertension, mild intermittent asthma, IgA deficiency, ADD.  Generally doing well.  His daughter will start kindergarten next year.  Wong continues to exercise about 6 days/week.  Has been little frustrated recently with some mild weight gain and inability to lose-although looking back his weight has been fairly stable past few years.  Health maintenance reviewed  -Flu vaccine already given -Tetanus up-to-date -Prior hepatitis C screen negative  Social history-married with 3-year-old daughter.  No history of smoking.  No alcohol regularly.  Works at [employer]  Family history father had melanoma and prostate cancer.  He died suddenly at age 23 presumably of MI.  Mother also had history of MI around age 26.  He had a paternal grandfather with MI at 1 and maternal uncle who died around age 66 of MI.  His mother also had reportedly history of some type of leukemia.  Past Medical History:  Diagnosis Date   Asthma    Past Surgical History:  Procedure Laterality Date   ANTERIOR CRUCIATE LIGAMENT REPAIR Right 2010   TONSILLECTOMY AND ADENOIDECTOMY     as a child    reports that he has never smoked. He has never used smokeless tobacco. He reports current alcohol use. He reports that he does not use drugs. family history includes Cancer in his father; Cirrhosis in his mother; Heart disease in his maternal grandfather, maternal grandmother, paternal grandfather, and paternal grandmother; Heart disease (age of onset: 13) in his father; Hypertension in his father. Allergies  Allergen Reactions   2,4-D Dimethylamine Hives   Phenergan [Promethazine Hcl] Nausea And Vomiting      Review of Systems  Constitutional:   Negative for chills, fever, malaise/fatigue and weight loss.  HENT:  Negative for hearing loss.   Eyes:  Negative for blurred vision and double vision.  Respiratory:  Negative for cough and shortness of breath.   Cardiovascular:  Negative for chest pain, palpitations and leg swelling.  Gastrointestinal:  Negative for abdominal pain, blood in stool, constipation and diarrhea.  Genitourinary:  Negative for dysuria.  Skin:  Negative for rash.  Neurological:  Negative for dizziness, speech change, seizures, loss of consciousness and headaches.  Psychiatric/Behavioral:  Negative for depression.       Objective:     BP 118/70 (BP Location: Left Arm, Patient Position: Sitting, Cuff Size: Normal)   Pulse 76   Temp 97.9 F (36.6 C) (Oral)   Ht 5' 10.08" (1.78 m)   Wt 232 lb 4.8 oz (105.4 kg)   SpO2 98%   BMI 33.26 kg/m  BP Readings from Last 3 Encounters:  05/23/22 118/70  05/05/21 115/68  02/10/21 124/70   Wt Readings from Last 3 Encounters:  05/23/22 232 lb 4.8 oz (105.4 kg)  06/14/21 229 lb (103.9 kg)  05/05/21 233 lb 12.8 oz (106.1 kg)      Physical Exam Vitals reviewed.  Constitutional:      General: He is not in acute distress.    Appearance: He is well-developed.  HENT:     Head: Normocephalic and atraumatic.     Right Ear: External ear normal.     Left Ear: External ear normal.  Eyes:  Conjunctiva/sclera: Conjunctivae normal.     Pupils: Pupils are equal, round, and reactive to light.  Neck:     Thyroid: No thyromegaly.  Cardiovascular:     Rate and Rhythm: Normal rate and regular rhythm.     Heart sounds: Normal heart sounds. No murmur heard. Pulmonary:     Effort: No respiratory distress.     Breath sounds: No wheezing or rales.  Abdominal:     General: Bowel sounds are normal. There is no distension.     Palpations: Abdomen is soft. There is no mass.     Tenderness: There is no abdominal tenderness. There is no guarding or rebound.  Musculoskeletal:      Cervical back: Normal range of motion and neck supple.  Lymphadenopathy:     Cervical: No cervical adenopathy.  Skin:    Findings: No rash.  Neurological:     Mental Status: He is alert and oriented to person, place, and time.     Cranial Nerves: No cranial nerve deficit.      No results found for any visits on 05/23/22.    The ASCVD Risk score (Arnett DK, et al., 2019) failed to calculate for the following reasons:   The 2019 ASCVD risk score is only valid for ages 48 to 8    Assessment & Plan:   Problem List Items Addressed This Visit   None Visit Diagnoses     Physical exam    -  Primary   Relevant Orders   Basic metabolic panel   Lipid panel   CBC with Differential/Platelet   TSH   Hepatic function panel   Family history of early CAD       Relevant Orders   CT CARDIAC SCORING (SELF PAY ONLY)     -Patient questions regarding coronary calcium scanning.  Even though he is not yet 42 he does have very strong family history of premature CAD in multiple family members.  We agreed to going had a status up.  -Continue regular exercise habits  -Continue low saturated fat diet  -Vaccines up-to-date  -Obtain screening labs as above  No follow-ups on file.    Evelena Peat, MD

## 2022-05-24 ENCOUNTER — Other Ambulatory Visit: Payer: Self-pay | Admitting: Family Medicine

## 2022-05-31 ENCOUNTER — Ambulatory Visit (HOSPITAL_COMMUNITY)
Admission: RE | Admit: 2022-05-31 | Discharge: 2022-05-31 | Disposition: A | Discharge status: Home / Self Care | Payer: BC Managed Care – PPO | Source: Ambulatory Visit | Attending: Family Medicine | Admitting: Family Medicine

## 2022-05-31 ENCOUNTER — Encounter: Payer: Self-pay | Admitting: Family Medicine

## 2022-05-31 DIAGNOSIS — Z8249 Family history of ischemic heart disease and other diseases of the circulatory system: Secondary | ICD-10-CM | POA: Insufficient documentation

## 2022-06-04 ENCOUNTER — Telehealth: Payer: Self-pay | Admitting: Family Medicine

## 2022-06-04 NOTE — Telephone Encounter (Signed)
Pt called, returning CMA's call. CMA was unavailable. Pt asked that CMA call back at their earliest convenience. 

## 2022-06-04 NOTE — Telephone Encounter (Signed)
Please see result note 

## 2022-06-12 ENCOUNTER — Other Ambulatory Visit: Payer: Self-pay | Admitting: Family Medicine

## 2022-06-12 MED ORDER — AMPHETAMINE-DEXTROAMPHETAMINE 20 MG PO TABS: ORAL_TABLET | ORAL | 0 refills | Status: DC

## 2022-06-12 MED ORDER — AMPHETAMINE-DEXTROAMPHETAMINE 20 MG PO TABS: 20.0000 mg | ORAL_TABLET | Freq: Two times a day (BID) | ORAL | 0 refills | Status: DC

## 2022-06-23 ENCOUNTER — Telehealth: Payer: BC Managed Care – PPO | Admitting: Nurse Practitioner

## 2022-06-23 DIAGNOSIS — U071 COVID-19: Secondary | ICD-10-CM

## 2022-06-23 MED ORDER — NIRMATRELVIR/RITONAVIR (PAXLOVID)TABLET: 3.0000 | ORAL_TABLET | Freq: Two times a day (BID) | ORAL | 0 refills | Status: AC

## 2022-06-23 NOTE — Progress Notes (Signed)
Virtual Visit Consent   Jeffrey [name], you are scheduled for a virtual visit with a [facility] provider today. Just as with appointments in the office, your consent must be obtained to participate. Your consent will be active for this visit and any virtual visit you may have with one of our providers in the next 365 days. If you have a MyChart account, a copy of this consent can be sent to you electronically.  As this is a virtual visit, video technology does not allow for your provider to perform a traditional examination. This may limit your provider's ability to fully assess your condition. If your provider identifies any concerns that need to be evaluated in person or the need to arrange testing (such as labs, EKG, etc.), we will make arrangements to do so. Although advances in technology are sophisticated, we cannot ensure that it will always work on either your end or our end. If the connection with a video visit is poor, the visit may have to be switched to a telephone visit. With either a video or telephone visit, we are not always able to ensure that we have a secure connection.  By engaging in this virtual visit, you consent to the provision of healthcare and authorize for your insurance to be billed (if applicable) for the services provided during this visit. Depending on your insurance coverage, you may receive a charge related to this service.  I need to obtain your verbal consent now. Are you willing to proceed with your visit today? Jeffrey [name] has provided verbal consent on 06/23/2022 for a virtual visit (video or telephone). Jeffrey Rigg, NP  Date: 06/23/2022 10:35 AM  Virtual Visit via Video Note   I, Jeffrey [name], connected with  Jeffrey [name]  ([id number], Sep 04, 1980) on 06/23/22 at 10:45 AM EST by a video-enabled telemedicine application and verified that I am speaking with the correct person using two identifiers.  Location: Patient: Virtual Visit Location Patient:  Home Provider: Virtual Visit Location Provider: Home Office   I discussed the limitations of evaluation and management by telemedicine and the availability of in person appointments. The patient expressed understanding and agreed to proceed.    History of Present Illness: Jeffrey [name] is a 42 y.o. who identifies as a male who was assigned male at birth, and is being seen today for COVID POSITIVE.  Tested positive for COVID this morning with a home antigen test. Symptoms started yesterday: chills, nasal and chest congestion, cough, headache, frontal pressure, sore throat. Wife is also COVID positive and currently taking paxlovid. He is requesting as well today.   Problems:  Patient Active Problem List   Diagnosis Date Noted   Hypertension 11/26/2018   Obesity (BMI 30-39.9) 07/16/2014   ADD (attention deficit disorder) 11/14/2012   IgA deficiency (HCC) 10/01/2012   Asthma, mild intermittent 10/01/2012    Allergies:  Allergies  Allergen Reactions   2,4-D Dimethylamine Hives   Phenergan [Promethazine Hcl] Nausea And Vomiting   Medications:  Current Outpatient Medications:    nirmatrelvir/ritonavir (PAXLOVID) 20 x 150 MG & 10 x 100MG  TABS, Take 3 tablets by mouth 2 (two) times daily for 5 days. (Take nirmatrelvir 150 mg two tablets twice daily for 5 days and ritonavir 100 mg one tablet twice daily for 5 days) Patient GFR is 109, Disp: 30 tablet, Rfl: 0   albuterol (VENTOLIN HFA) 108 (90 Base) MCG/ACT inhaler, Inhale 1-2 puffs into the lungs every 6 (six) hours as needed for wheezing or shortness  of breath., Disp: 8.5 each, Rfl: 2   amphetamine-dextroamphetamine (ADDERALL) 20 MG tablet, Take 1 tablet (20 mg total) by mouth 2 (two) times daily., Disp: 60 tablet, Rfl: 0   amphetamine-dextroamphetamine (ADDERALL) 20 MG tablet, Take one tablet by mouth twice daily.   May refill in one month., Disp: 60 tablet, Rfl: 0   amphetamine-dextroamphetamine (ADDERALL) 20 MG tablet, Take one tablet by  mouth twice daily.  May refill in two months., Disp: 60 tablet, Rfl: 0   augmented betamethasone dipropionate (DIPROLENE-AF) 0.05 % ointment, Apply topically 2 (two) times daily., Disp: 15 g, Rfl: 1   benzonatate (TESSALON) 100 MG capsule, Take 1 capsule (100 mg total) by mouth 3 (three) times daily as needed., Disp: 30 capsule, Rfl: 0   escitalopram (LEXAPRO) 10 MG tablet, TAKE 1 TABLET BY MOUTH EVERY DAY, Disp: 90 tablet, Rfl: 0   Multiple Vitamins-Minerals (MENS MULTI VITAMIN & MINERAL PO), Take by mouth daily., Disp: , Rfl:    predniSONE (STERAPRED UNI-PAK 21 TAB) 10 MG (21) TBPK tablet, 6 day taper; take as directed on package instructions, Disp: 21 tablet, Rfl: 0   triamcinolone cream (KENALOG) 0.1 %, Apply 1 Application topically 2 (two) times daily as needed., Disp: 453 g, Rfl: 1   valsartan (DIOVAN) 80 MG tablet, TAKE 1 TABLET BY MOUTH EVERY DAY, Disp: 90 tablet, Rfl: 3  Observations/Objective: Patient is well-developed, well-nourished in no acute distress.  Resting comfortably at home.  Head is normocephalic, atraumatic.  No labored breathing.  Speech is clear and coherent with logical content.  Patient is alert and oriented at baseline.    Assessment and Plan: 1. Positive self-administered antigen test for COVID-19 - nirmatrelvir/ritonavir (PAXLOVID) 20 x 150 MG & 10 x 100MG  TABS; Take 3 tablets by mouth 2 (two) times daily for 5 days. (Take nirmatrelvir 150 mg two tablets twice daily for 5 days and ritonavir 100 mg one tablet twice daily for 5 days) Patient GFR is 109  Dispense: 30 tablet; Refill: 0   Please keep well-hydrated and get plenty of rest. Start a saline nasal rinse to flush out your nasal passages. You can use plain Mucinex to help thin congestion. If you have a humidifier, you can use this daily as needed.    You are to wear a mask for 5 days from onset of your symptoms.  After day 5, if you have had no fever and you are feeling better with NO symptoms, you can  end masking. Keep in mind you can be contagious 10 days from the onset of symptoms  After day 5 if you have a fever or are having significant symptoms, please wear your mask for full 10 days.   If you note any worsening of symptoms, any significant shortness of breath or any chest pain, please seek ER evaluation ASAP.  Please do not delay care!    If you note any worsening of symptoms, any significant shortness of breath or any chest pain, please seek ER evaluation ASAP.  Please do not delay care!   Follow Up Instructions: I discussed the assessment and treatment plan with the patient. The patient was provided an opportunity to ask questions and all were answered. The patient agreed with the plan and demonstrated an understanding of the instructions.  A copy of instructions were sent to the patient via MyChart unless otherwise noted below.    The patient was advised to call back or seek an in-person evaluation if the symptoms worsen or if the condition  fails to improve as anticipated.  Time:  I spent 11 minutes with the patient via telehealth technology discussing the above problems/concerns.    Gildardo Pounds, NP

## 2022-06-23 NOTE — Patient Instructions (Signed)
[name], thank you for joining [name], NP for today's virtual visit.  While this provider is not your primary care provider (PCP), if your PCP is located in our provider database this encounter information will be shared with them immediately following your visit.   A [facility] MyChart account gives you access to today's visit and all your visits, tests, and labs performed at [facility] " click here if you don't have a [facility] MyChart account or go to [email or web address]  Consent: (Patient) Jeffrey [name] provided verbal consent for this virtual visit at the beginning of the encounter.  Current Medications:  Current Outpatient Medications:    nirmatrelvir/ritonavir (PAXLOVID) 20 x 150 MG & 10 x 100MG  TABS, Take 3 tablets by mouth 2 (two) times daily for 5 days. (Take nirmatrelvir 150 mg two tablets twice daily for 5 days and ritonavir 100 mg one tablet twice daily for 5 days) Patient GFR is 109, Disp: 30 tablet, Rfl: 0   albuterol (VENTOLIN HFA) 108 (90 Base) MCG/ACT inhaler, Inhale 1-2 puffs into the lungs every 6 (six) hours as needed for wheezing or shortness of breath., Disp: 8.5 each, Rfl: 2   amphetamine-dextroamphetamine (ADDERALL) 20 MG tablet, Take 1 tablet (20 mg total) by mouth 2 (two) times daily., Disp: 60 tablet, Rfl: 0   amphetamine-dextroamphetamine (ADDERALL) 20 MG tablet, Take one tablet by mouth twice daily.   May refill in one month., Disp: 60 tablet, Rfl: 0   amphetamine-dextroamphetamine (ADDERALL) 20 MG tablet, Take one tablet by mouth twice daily.  May refill in two months., Disp: 60 tablet, Rfl: 0   augmented betamethasone dipropionate (DIPROLENE-AF) 0.05 % ointment, Apply topically 2 (two) times daily., Disp: 15 g, Rfl: 1   benzonatate (TESSALON) 100 MG capsule, Take 1 capsule (100 mg total) by mouth 3 (three) times daily as needed., Disp: 30 capsule, Rfl: 0   escitalopram (LEXAPRO) 10 MG tablet, TAKE 1 TABLET BY MOUTH EVERY DAY,  Disp: 90 tablet, Rfl: 0   Multiple Vitamins-Minerals (MENS MULTI VITAMIN & MINERAL PO), Take by mouth daily., Disp: , Rfl:    predniSONE (STERAPRED UNI-PAK 21 TAB) 10 MG (21) TBPK tablet, 6 day taper; take as directed on package instructions, Disp: 21 tablet, Rfl: 0   triamcinolone cream (KENALOG) 0.1 %, Apply 1 Application topically 2 (two) times daily as needed., Disp: 453 g, Rfl: 1   valsartan (DIOVAN) 80 MG tablet, TAKE 1 TABLET BY MOUTH EVERY DAY, Disp: 90 tablet, Rfl: 3   Medications ordered in this encounter:  Meds ordered this encounter  Medications   nirmatrelvir/ritonavir (PAXLOVID) 20 x 150 MG & 10 x 100MG  TABS    Sig: Take 3 tablets by mouth 2 (two) times daily for 5 days. (Take nirmatrelvir 150 mg two tablets twice daily for 5 days and ritonavir 100 mg one tablet twice daily for 5 days) Patient GFR is 109    Dispense:  30 tablet    Refill:  0    Order Specific Question:   Supervising Provider    Answer:        *If you need refills on other medications prior to your next appointment, please contact your pharmacy*  Follow-Up: Call back or seek an in-person evaluation if the symptoms worsen or if the condition fails to improve as anticipated.  [facility] [phone]  Other Instructions  Please keep well-hydrated and get plenty of rest. Start a saline nasal rinse to flush out your nasal  passages. You can use plain Mucinex to help thin congestion. If you have a humidifier, you can use this daily as needed.    You are to wear a mask for 5 days from onset of your symptoms.  After day 5, if you have had no fever and you are feeling better with NO symptoms, you can end masking. Keep in mind you can be contagious 10 days from the onset of symptoms  After day 5 if you have a fever or are having significant symptoms, please wear your mask for full 10 days.   If you note any worsening of symptoms, any significant shortness of breath or  any chest pain, please seek ER evaluation ASAP.  Please do not delay care!    If you note any worsening of symptoms, any significant shortness of breath or any chest pain, please seek ER evaluation ASAP.  Please do not delay care!    If you have been instructed to have an in-person evaluation today at a local Urgent Care facility, please use the link below. It will take you to a list of all of our available [facility], including address, phone number and hours of operation. Please do not delay care.  [facility]  If you or a family member do not have a primary care provider, use the link below to schedule a visit and establish care. When you choose a [facility] primary care physician or advanced practice provider, you gain a long-term partner in health. Find a Primary Care Provider  Learn more about [facility]'s in-office and virtual care options: [facility]

## 2022-07-10 ENCOUNTER — Encounter: Payer: Self-pay | Admitting: Family Medicine

## 2022-08-07 ENCOUNTER — Telehealth: Payer: BC Managed Care – PPO | Admitting: Physician Assistant

## 2022-08-07 DIAGNOSIS — B9689 Other specified bacterial agents as the cause of diseases classified elsewhere: Secondary | ICD-10-CM | POA: Diagnosis not present

## 2022-08-07 DIAGNOSIS — J028 Acute pharyngitis due to other specified organisms: Secondary | ICD-10-CM | POA: Diagnosis not present

## 2022-08-07 MED ORDER — AMOXICILLIN 500 MG PO CAPS: 500.0000 mg | ORAL_CAPSULE | Freq: Two times a day (BID) | ORAL | 0 refills | Status: AC

## 2022-08-07 NOTE — Progress Notes (Signed)

## 2022-08-10 ENCOUNTER — Encounter: Payer: Self-pay | Admitting: Family Medicine

## 2022-08-10 MED ORDER — ALBUTEROL SULFATE HFA 108 (90 BASE) MCG/ACT IN AERS: 1.0000 | INHALATION_SPRAY | Freq: Four times a day (QID) | RESPIRATORY_TRACT | 2 refills | Status: DC | PRN

## 2022-08-22 ENCOUNTER — Other Ambulatory Visit: Payer: Self-pay | Admitting: Family Medicine

## 2022-08-22 MED ORDER — AMPHETAMINE-DEXTROAMPHETAMINE 20 MG PO TABS: ORAL_TABLET | ORAL | 0 refills | Status: DC

## 2022-08-22 MED ORDER — AMPHETAMINE-DEXTROAMPHETAMINE 20 MG PO TABS: 20.0000 mg | ORAL_TABLET | Freq: Two times a day (BID) | ORAL | 0 refills | Status: DC

## 2022-08-23 ENCOUNTER — Other Ambulatory Visit: Payer: Self-pay | Admitting: Family Medicine

## 2022-12-10 ENCOUNTER — Telehealth: Payer: BC Managed Care – PPO | Admitting: Physician Assistant

## 2022-12-10 DIAGNOSIS — J020 Streptococcal pharyngitis: Secondary | ICD-10-CM | POA: Diagnosis not present

## 2022-12-10 MED ORDER — AMOXICILLIN 500 MG PO CAPS: 500.0000 mg | ORAL_CAPSULE | Freq: Two times a day (BID) | ORAL | 0 refills | Status: AC

## 2022-12-10 NOTE — Progress Notes (Signed)

## 2023-01-01 ENCOUNTER — Other Ambulatory Visit: Payer: Self-pay | Admitting: Family Medicine

## 2023-01-01 MED ORDER — AMPHETAMINE-DEXTROAMPHETAMINE 20 MG PO TABS: ORAL_TABLET | ORAL | 0 refills | Status: DC

## 2023-01-01 MED ORDER — AMPHETAMINE-DEXTROAMPHETAMINE 20 MG PO TABS: 20.0000 mg | ORAL_TABLET | Freq: Two times a day (BID) | ORAL | 0 refills | Status: DC

## 2023-01-28 ENCOUNTER — Encounter: Payer: Self-pay | Admitting: Family Medicine

## 2023-01-28 MED ORDER — TRIAMCINOLONE ACETONIDE 0.1 % EX CREA: 1.0000 | TOPICAL_CREAM | Freq: Two times a day (BID) | CUTANEOUS | 1 refills | Status: AC | PRN

## 2023-03-21 ENCOUNTER — Other Ambulatory Visit: Payer: Self-pay | Admitting: Family Medicine

## 2023-03-21 MED ORDER — AMPHETAMINE-DEXTROAMPHETAMINE 20 MG PO TABS: 20.0000 mg | ORAL_TABLET | Freq: Two times a day (BID) | ORAL | 0 refills | Status: DC

## 2023-03-21 NOTE — Telephone Encounter (Signed)
Done for one month  ?

## 2023-04-08 DIAGNOSIS — F909 Attention-deficit hyperactivity disorder, unspecified type: Secondary | ICD-10-CM | POA: Diagnosis not present

## 2023-04-22 DIAGNOSIS — F909 Attention-deficit hyperactivity disorder, unspecified type: Secondary | ICD-10-CM | POA: Diagnosis not present

## 2023-04-29 ENCOUNTER — Other Ambulatory Visit: Payer: Self-pay | Admitting: Family Medicine

## 2023-04-30 MED ORDER — AMPHETAMINE-DEXTROAMPHETAMINE 20 MG PO TABS: 20.0000 mg | ORAL_TABLET | Freq: Two times a day (BID) | ORAL | 0 refills | Status: DC

## 2023-04-30 NOTE — Telephone Encounter (Signed)
Refilled Adderall.   Recommend office follow up soon.  Kristian Covey MD [facility] at [facility]

## 2023-05-08 DIAGNOSIS — F909 Attention-deficit hyperactivity disorder, unspecified type: Secondary | ICD-10-CM | POA: Diagnosis not present

## 2023-05-12 ENCOUNTER — Telehealth: Payer: BC Managed Care – PPO | Admitting: Nurse Practitioner

## 2023-05-12 DIAGNOSIS — H10022 Other mucopurulent conjunctivitis, left eye: Secondary | ICD-10-CM

## 2023-05-12 MED ORDER — POLYMYXIN B-TRIMETHOPRIM 10000-0.1 UNIT/ML-% OP SOLN: 1.0000 [drp] | OPHTHALMIC | 0 refills | Status: DC

## 2023-05-12 NOTE — Progress Notes (Signed)

## 2023-05-12 NOTE — Progress Notes (Signed)
I have spent 5 minutes in review of e-visit questionnaire, review and updating patient chart, medical decision making and response to patient.  ° °Jerrell Mangel W Secilia Apps, NP ° °  °

## 2023-05-18 ENCOUNTER — Other Ambulatory Visit: Payer: Self-pay | Admitting: Family Medicine

## 2023-05-20 DIAGNOSIS — F909 Attention-deficit hyperactivity disorder, unspecified type: Secondary | ICD-10-CM | POA: Diagnosis not present

## 2023-05-22 ENCOUNTER — Telehealth: Payer: BC Managed Care – PPO | Admitting: Physician Assistant

## 2023-05-22 DIAGNOSIS — B370 Candidal stomatitis: Secondary | ICD-10-CM

## 2023-05-23 MED ORDER — NYSTATIN 100000 UNIT/ML MT SUSP: 5.0000 mL | Freq: Four times a day (QID) | OROMUCOSAL | 0 refills | Status: DC

## 2023-05-23 NOTE — Progress Notes (Signed)
E-Visit for Jeffrey [name]  We are sorry that you are not feeling well.  Here is how we plan to help!  Based on what you have shared with me, it appears that you do have oral thrush.     The following medications should decrease the discomfort and help with healing.  Nystatin Suspension Swish and swallow 5mL every 6 hours as needed for thrush  Prevention:  Avoid allowing any tablets to dissolve in your mouth that are meant to swallowed whole Avoid foods/drinks that trigger or worsen symptoms Keep your mouth clean with daily brushing and flossing  Home Care: The goal with treatment is to ease the pain where ulcers occur and help them heal as quickly as possible.  There is no medical treatment to prevent mouth ulcers from coming back or recurring.  Avoid spicy and acidic foods Eat soft foods and avoid rough, crunchy foods Avoid chewing gum Do not use toothpaste that contains sodium lauryl sulphite Use a straw to drink which helps avoid liquids toughing the ulcers near the front of your mouth Use a very soft toothbrush If you have dentures or dental hardware that you feel is not fitting well or contributing to his, please see your dentist. Use saltwater mouthwash which helps healing. Dissolve a  teaspoon of salt in a glass of warm water. Swish around your mouth and spit it out. This can be used as needed if it is soothing.   GET HELP RIGHT AWAY IF: Persistent ulcers require checking IN PERSON (face to face). Any mouth lesion lasting longer than a month should be seen by your DENTIST as soon as possible for evaluation for possible oral cancer. If you have a non-painful ulcer in 1 or more areas of your mouth Ulcers that are spreading, are very large or particularly painful Ulcers last longer than one week without improving on treatment If you develop a fever, swollen glands and begin to feel unwell Ulcers that developed after starting a new medication MAKE SURE YOU: Understand these  instructions. Will watch your condition. Will get help right away if you are not doing well or get worse.  Thank you for choosing an e-visit.  Your e-visit answers were reviewed by a board certified advanced clinical practitioner to complete your personal care plan. Depending upon the condition, your plan could have included both over the counter or prescription medications.  Please review your pharmacy choice. Make sure the pharmacy is open so you can pick up prescription now. If there is a problem, you may contact your provider through [employer] and have the prescription routed to another pharmacy.  Your safety is important to [place]. If you have drug allergies check your prescription carefully.   For the next 24 hours you can use MyChart to ask questions about today's visit, request a non-urgent call back, or ask for a work or school excuse. You will get an email in the next two days asking about your experience. I hope that your e-visit has been valuable and will speed your recovery.   I have spent 5 minutes in review of e-visit questionnaire, review and updating patient chart, medical decision making and response to patient.   Margaretann Loveless, PA-C

## 2023-06-03 DIAGNOSIS — F909 Attention-deficit hyperactivity disorder, unspecified type: Secondary | ICD-10-CM | POA: Diagnosis not present

## 2023-06-05 ENCOUNTER — Other Ambulatory Visit: Payer: Self-pay | Admitting: Family Medicine

## 2023-06-06 MED ORDER — AMPHETAMINE-DEXTROAMPHETAMINE 20 MG PO TABS: 20.0000 mg | ORAL_TABLET | Freq: Two times a day (BID) | ORAL | 0 refills | Status: DC

## 2023-06-06 NOTE — Telephone Encounter (Signed)
Refilled medication.   Needs office follow up soon  Jeffrey Covey MD [facility] at [facility]

## 2023-06-24 DIAGNOSIS — F909 Attention-deficit hyperactivity disorder, unspecified type: Secondary | ICD-10-CM | POA: Diagnosis not present

## 2023-07-06 ENCOUNTER — Telehealth: Payer: BC Managed Care – PPO | Admitting: Physician Assistant

## 2023-07-06 DIAGNOSIS — J069 Acute upper respiratory infection, unspecified: Secondary | ICD-10-CM

## 2023-07-07 ENCOUNTER — Encounter: Payer: Self-pay | Admitting: Physician Assistant

## 2023-07-07 NOTE — Progress Notes (Signed)
E-Visit for Cough   We are sorry that you are not feeling well.  Here is how we plan to help!  Based on your presentation I believe you most likely have A cough due to a virus.  This is called viral bronchitis and is best treated by rest, plenty of fluids and control of the cough.  You may use Ibuprofen or Tylenol as directed to help your symptoms.     In addition you may use A prescription cough medication called Tessalon Perles 100mg . You may take 1-2 capsules every 8 hours as needed for your cough.  Providers prescribe antibiotics to treat infections caused by bacteria. Antibiotics are very powerful in treating bacterial infections when they are used properly. To maintain their effectiveness, they should be used only when necessary. Overuse of antibiotics has resulted in the development of superbugs that are resistant to treatment!    After careful review of your answers, I would not recommend an antibiotic for your condition.  Antibiotics are not effective against viruses and therefore should not be used to treat them. Common examples of infections caused by viruses include colds and flu   From your responses in the eVisit questionnaire you describe inflammation in the upper respiratory tract which is causing a significant cough.  This is commonly called Bronchitis and has four common causes:   Allergies Viral Infections Acid Reflux Bacterial Infection Allergies, viruses and acid reflux are treated by controlling symptoms or eliminating the cause. An example might be a cough caused by taking certain blood pressure medications. You stop the cough by changing the medication. Another example might be a cough caused by acid reflux. Controlling the reflux helps control the cough.  USE OF BRONCHODILATOR ("RESCUE") INHALERS: There is a risk from using your bronchodilator too frequently.  The risk is that over-reliance on a medication which only relaxes the muscles surrounding the breathing tubes  can reduce the effectiveness of medications prescribed to reduce swelling and congestion of the tubes themselves.  Although you feel brief relief from the bronchodilator inhaler, your asthma may actually be worsening with the tubes becoming more swollen and filled with mucus.  This can delay other crucial treatments, such as oral steroid medications. If you need to use a bronchodilator inhaler daily, several times per day, you should discuss this with your provider.  There are probably better treatments that could be used to keep your asthma under control.     HOME CARE Only take medications as instructed by your medical team. Complete the entire course of an antibiotic. Drink plenty of fluids and get plenty of rest. Avoid close contacts especially the very young and the elderly Cover your mouth if you cough or cough into your sleeve. Always remember to wash your hands A steam or ultrasonic humidifier can help congestion.   GET HELP RIGHT AWAY IF: You develop worsening fever. You become short of breath You cough up blood. Your symptoms persist after you have completed your treatment plan MAKE SURE YOU  Understand these instructions. Will watch your condition. Will get help right away if you are not doing well or get worse.    Thank you for choosing an e-visit.  Your e-visit answers were reviewed by a board certified advanced clinical practitioner to complete your personal care plan. Depending upon the condition, your plan could have included both over the counter or prescription medications.  Please review your pharmacy choice. Make sure the pharmacy is open so you can pick up prescription now.  If there is a problem, you may contact your provider through [employer] and have the prescription routed to another pharmacy.  Your safety is important to [place]. If you have drug allergies check your prescription carefully.   For the next 24 hours you can use MyChart to ask questions about  today's visit, request a non-urgent call back, or ask for a work or school excuse. You will get an email in the next two days asking about your experience. I hope that your e-visit has been valuable and will speed your recovery.

## 2023-07-07 NOTE — Progress Notes (Signed)
I have spent 5 minutes in review of e-visit questionnaire, review and updating patient chart, medical decision making and response to patient.   Laure Kidney, PA-C

## 2023-07-15 DIAGNOSIS — F909 Attention-deficit hyperactivity disorder, unspecified type: Secondary | ICD-10-CM | POA: Diagnosis not present

## 2023-07-16 ENCOUNTER — Other Ambulatory Visit: Payer: Self-pay | Admitting: Family Medicine

## 2023-07-16 MED ORDER — AMPHETAMINE-DEXTROAMPHETAMINE 20 MG PO TABS: 20.0000 mg | ORAL_TABLET | Freq: Two times a day (BID) | ORAL | 0 refills | Status: DC

## 2023-07-16 NOTE — Telephone Encounter (Signed)
Refilled once.  Needs office follow-up.  Not seen in over a year.  Kristian Covey MD [facility] at [facility]

## 2023-08-12 DIAGNOSIS — F909 Attention-deficit hyperactivity disorder, unspecified type: Secondary | ICD-10-CM | POA: Diagnosis not present

## 2023-08-17 ENCOUNTER — Other Ambulatory Visit: Payer: Self-pay | Admitting: Family Medicine

## 2023-08-24 ENCOUNTER — Other Ambulatory Visit: Payer: Self-pay | Admitting: Family Medicine

## 2023-08-26 ENCOUNTER — Encounter: Payer: Self-pay | Admitting: Family Medicine

## 2023-08-27 MED ORDER — AMPHETAMINE-DEXTROAMPHETAMINE 20 MG PO TABS: 20.0000 mg | ORAL_TABLET | Freq: Two times a day (BID) | ORAL | 0 refills | Status: DC

## 2023-08-27 NOTE — Telephone Encounter (Signed)
Refill sent   [name] MD [facility] at [facility]

## 2023-08-28 NOTE — Addendum Note (Signed)
 Addended by: Christy Sartorius on: 08/28/2023 07:30 AM   Modules accepted: Orders

## 2023-08-29 MED ORDER — AMPHETAMINE-DEXTROAMPHETAMINE 20 MG PO TABS: 20.0000 mg | ORAL_TABLET | Freq: Two times a day (BID) | ORAL | 0 refills | Status: DC

## 2023-08-29 NOTE — Addendum Note (Signed)
 Addended by: Kristian Covey on: 08/29/2023 01:23 PM   Modules accepted: Orders

## 2023-08-30 ENCOUNTER — Ambulatory Visit (INDEPENDENT_AMBULATORY_CARE_PROVIDER_SITE_OTHER): Admitting: Family Medicine

## 2023-08-30 ENCOUNTER — Encounter: Payer: Self-pay | Admitting: Family Medicine

## 2023-08-30 VITALS — BP 120/76 | HR 85 | Temp 97.4°F | Ht 69.69 in | Wt 201.9 lb

## 2023-08-30 DIAGNOSIS — Z0000 Encounter for general adult medical examination without abnormal findings: Secondary | ICD-10-CM | POA: Diagnosis not present

## 2023-08-30 MED ORDER — ESCITALOPRAM OXALATE 10 MG PO TABS: 10.0000 mg | ORAL_TABLET | Freq: Every day | ORAL | 3 refills | Status: DC

## 2023-08-30 MED ORDER — AMPHETAMINE-DEXTROAMPHET ER 30 MG PO CP24: 30.0000 mg | ORAL_CAPSULE | ORAL | 0 refills | Status: DC

## 2023-08-30 MED ORDER — VALSARTAN 80 MG PO TABS: 80.0000 mg | ORAL_TABLET | Freq: Every day | ORAL | 3 refills | Status: DC

## 2023-08-30 NOTE — Progress Notes (Signed)
 Established Patient Office Visit  Subjective   Patient ID: Jeffrey [name], male    DOB: 01/03/1981  Age: 43 y.o. MRN: [id number]  Chief Complaint  Patient presents with   Annual Exam    HPI   Jeffrey [name] is seen today for physical exam.  He has history of hypertension, ADD, IgA deficiency.  He has been very diligent with regard to diet and exercise and has lost a substantial amount of weight over the past several years.  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  He and his wife have 74-year-old daughter.  He has been recently taken Adderall immediate release 20 mg twice daily but would like to explore going back to extended release.  There have been some issues with availability of immediate release recently.  He does remain on Diovan 80 mg daily for hypertension.  Also takes low-dose Lexapro 10 mg.  Very strong family history of premature CAD as below.  He had coronary calcium score of 0 back in 2023.  Health maintenance reviewed:  -Declines pneumonia vaccination.  He has history of mild intermittent asthma but no other significant risk factors -Prior hepatitis C screening negative -Does get annual flu vaccine -Tetanus due next year  Social history-married.  61-year-old daughter.  Works for [employer] firm.  Non-smoker.  No regular alcohol.  Exercises regularly.  Family history-father had melanoma and prostate cancer. He died suddenly at age 62 presumably of MI. Mother also had history of MI around age 47. He had a paternal grandfather with MI at 43 and maternal uncle who died around age 24 of MI. His mother also had reportedly history of some type of leukemia.   Past Medical History:  Diagnosis Date   Asthma    Past Surgical History:  Procedure Laterality Date   ANTERIOR CRUCIATE LIGAMENT REPAIR Right 2010   TONSILLECTOMY AND ADENOIDECTOMY     as a child    reports that he has never smoked. He has never used smokeless tobacco. He reports  current alcohol use. He reports that he does not use drugs. family history includes Cancer in his father; Cirrhosis in his mother; Heart disease in his maternal grandfather, maternal grandmother, paternal grandfather, and paternal grandmother; Heart disease (age of onset: 75) in his father; Hypertension in his father. Allergies  Allergen Reactions   2,4-D Dimethylamine Hives   Phenergan [Promethazine Hcl] Nausea And Vomiting      Review of Systems  Constitutional:  Negative for chills, fever, malaise/fatigue and weight loss.  HENT:  Negative for hearing loss.   Eyes:  Negative for blurred vision and double vision.  Respiratory:  Negative for cough and shortness of breath.   Cardiovascular:  Negative for chest pain, palpitations and leg swelling.  Gastrointestinal:  Negative for abdominal pain, blood in stool, constipation and diarrhea.  Genitourinary:  Negative for dysuria.  Skin:  Negative for rash.  Neurological:  Negative for dizziness, speech change, seizures, loss of consciousness and headaches.  Psychiatric/Behavioral:  Negative for depression.       Objective:     BP 120/76 (BP Location: Left Arm, Patient Position: Sitting, Cuff Size: Normal)   Pulse 85   Temp (!) 97.4 F (36.3 C) (Oral)   Ht 5' 9.69" (1.77 m)   Wt 201 lb 14.4 oz (91.6 kg)   SpO2 97%   BMI 29.23 kg/m  BP Readings from Last 3 Encounters:  08/30/23 120/76  05/23/22 118/70  05/05/21 115/68  Wt Readings from Last 3 Encounters:  08/30/23 201 lb 14.4 oz (91.6 kg)  05/23/22 232 lb 4.8 oz (105.4 kg)  06/14/21 229 lb (103.9 kg)      Physical Exam Vitals reviewed.  Constitutional:      General: He is not in acute distress.    Appearance: He is well-developed. He is not ill-appearing.  HENT:     Head: Normocephalic and atraumatic.     Right Ear: External ear normal.     Left Ear: External ear normal.  Eyes:     Conjunctiva/sclera: Conjunctivae normal.     Pupils: Pupils are equal, round, and  reactive to light.  Neck:     Thyroid: No thyromegaly.  Cardiovascular:     Rate and Rhythm: Normal rate and regular rhythm.     Heart sounds: Normal heart sounds. No murmur heard. Pulmonary:     Effort: No respiratory distress.     Breath sounds: No wheezing or rales.  Abdominal:     General: Bowel sounds are normal. There is no distension.     Palpations: Abdomen is soft. There is no mass.     Tenderness: There is no abdominal tenderness. There is no guarding or rebound.  Musculoskeletal:     Cervical back: Normal range of motion and neck supple.     Right lower leg: No edema.     Left lower leg: No edema.  Lymphadenopathy:     Cervical: No cervical adenopathy.  Skin:    Findings: No rash.  Neurological:     Mental Status: He is alert and oriented to person, place, and time.     Cranial Nerves: No cranial nerve deficit.      No results found for any visits on 08/30/23.    The 10-year ASCVD risk score (Arnett DK, et al., 2019) is: 0.4%    Assessment & Plan:   Problem List Items Addressed This Visit   None Visit Diagnoses       Physical exam    -  Primary   Relevant Orders   PSA   Hepatic function panel   CBC with Differential/Platelet   Lipid panel   Basic metabolic panel     43 year old male with history of hypertension which is controlled with Diovan.  He has ADD and would like to consider switch to Adderall XR and we will send a new prescription.  We discussed the following health maintenance issues:  -Continue regular exercise habits -Check labs.  Include PSA baseline with his father having prostate cancer. -Continue low saturated fat diet -Will need tetanus booster by next year -Consider screening colonoscopy at age 66 -Given his strong family history of CAD we did discuss possible repeat coronary calcium score (in 6-8 years) or possible LP(a) at some point in the near future  No follow-ups on file.    Evelena Peat, MD

## 2023-08-31 LAB — HEPATIC FUNCTION PANEL
AG Ratio: 2.1 (calc) (ref 1.0–2.5)
ALT: 10 U/L (ref 9–46)
AST: 16 U/L (ref 10–40)
Albumin: 4.9 g/dL (ref 3.6–5.1)
Alkaline phosphatase (APISO): 42 U/L (ref 36–130)
Bilirubin, Direct: 0.1 mg/dL (ref 0.0–0.2)
Globulin: 2.3 g/dL (ref 1.9–3.7)
Indirect Bilirubin: 0.4 mg/dL (ref 0.2–1.2)
Total Bilirubin: 0.5 mg/dL (ref 0.2–1.2)
Total Protein: 7.2 g/dL (ref 6.1–8.1)

## 2023-08-31 LAB — CBC WITH DIFFERENTIAL/PLATELET
Absolute Lymphocytes: 1678 {cells}/uL (ref 850–3900)
Absolute Monocytes: 518 {cells}/uL (ref 200–950)
Basophils Absolute: 58 {cells}/uL (ref 0–200)
Basophils Relative: 0.8 %
Eosinophils Absolute: 403 {cells}/uL (ref 15–500)
Eosinophils Relative: 5.6 %
HCT: 45.6 % (ref 38.5–50.0)
Hemoglobin: 15.4 g/dL (ref 13.2–17.1)
MCH: 30.2 pg (ref 27.0–33.0)
MCHC: 33.8 g/dL (ref 32.0–36.0)
MCV: 89.4 fL (ref 80.0–100.0)
MPV: 9.8 fL (ref 7.5–12.5)
Monocytes Relative: 7.2 %
Neutro Abs: 4543 {cells}/uL (ref 1500–7800)
Neutrophils Relative %: 63.1 %
Platelets: 287 10*3/uL (ref 140–400)
RBC: 5.1 10*6/uL (ref 4.20–5.80)
RDW: 12.4 % (ref 11.0–15.0)
Total Lymphocyte: 23.3 %
WBC: 7.2 10*3/uL (ref 3.8–10.8)

## 2023-08-31 LAB — LIPID PANEL
Cholesterol: 134 mg/dL (ref <200)
HDL: 54 mg/dL (ref >=40)
LDL Cholesterol (Calc): 61 mg/dL
Non-HDL Cholesterol (Calc): 80 mg/dL (ref <130)
Total CHOL/HDL Ratio: 2.5 (calc) (ref <5.0)
Triglycerides: 107 mg/dL (ref <150)

## 2023-08-31 LAB — BASIC METABOLIC PANEL
BUN: 15 mg/dL (ref 7–25)
CO2: 30 mmol/L (ref 20–32)
Calcium: 9.7 mg/dL (ref 8.6–10.3)
Chloride: 100 mmol/L (ref 98–110)
Creat: 0.68 mg/dL (ref 0.60–1.29)
Glucose, Bld: 93 mg/dL (ref 65–99)
Potassium: 4.8 mmol/L (ref 3.5–5.3)
Sodium: 138 mmol/L (ref 135–146)

## 2023-08-31 LAB — PSA: PSA: 0.74 ng/mL (ref <=4.00)

## 2023-09-12 ENCOUNTER — Telehealth: Admitting: Physician Assistant

## 2023-09-12 DIAGNOSIS — J019 Acute sinusitis, unspecified: Secondary | ICD-10-CM | POA: Diagnosis not present

## 2023-09-12 MED ORDER — AMOXICILLIN-POT CLAVULANATE 875-125 MG PO TABS: 1.0000 | ORAL_TABLET | Freq: Two times a day (BID) | ORAL | 0 refills | Status: AC

## 2023-09-12 NOTE — Progress Notes (Signed)

## 2023-09-15 ENCOUNTER — Other Ambulatory Visit: Payer: Self-pay | Admitting: Family Medicine

## 2023-10-02 ENCOUNTER — Other Ambulatory Visit: Payer: Self-pay | Admitting: Family Medicine

## 2023-10-07 MED ORDER — AMPHETAMINE-DEXTROAMPHET ER 30 MG PO CP24: 30.0000 mg | ORAL_CAPSULE | ORAL | 0 refills | Status: DC

## 2024-01-13 ENCOUNTER — Encounter: Payer: Self-pay | Admitting: Family Medicine

## 2024-01-13 ENCOUNTER — Ambulatory Visit: Admitting: Family Medicine

## 2024-01-13 VITALS — BP 110/64 | HR 110 | Temp 97.6°F | Wt 187.7 lb

## 2024-01-13 DIAGNOSIS — R109 Unspecified abdominal pain: Secondary | ICD-10-CM | POA: Diagnosis not present

## 2024-01-13 DIAGNOSIS — R319 Hematuria, unspecified: Secondary | ICD-10-CM

## 2024-01-13 LAB — POC URINALSYSI DIPSTICK (AUTOMATED)
Bilirubin, UA: NEGATIVE
Blood, UA: POSITIVE
Glucose, UA: NEGATIVE
Ketones, UA: POSITIVE
Leukocytes, UA: NEGATIVE
Nitrite, UA: NEGATIVE
Protein, UA: POSITIVE — AB
Spec Grav, UA: 1.02 (ref 1.010–1.025)
Urobilinogen, UA: 0.2 U/dL
pH, UA: 6 (ref 5.0–8.0)

## 2024-01-13 MED ORDER — TAMSULOSIN HCL 0.4 MG PO CAPS: 0.4000 mg | ORAL_CAPSULE | Freq: Every day | ORAL | 0 refills | Status: DC

## 2024-01-13 MED ORDER — OXYCODONE HCL 5 MG PO CAPS: ORAL_CAPSULE | ORAL | 0 refills | Status: DC

## 2024-01-13 MED ORDER — ONDANSETRON 8 MG PO TBDP: 8.0000 mg | ORAL_TABLET | Freq: Three times a day (TID) | ORAL | 0 refills | Status: AC | PRN

## 2024-01-13 NOTE — Patient Instructions (Signed)
 Drink plenty of fluids today.  Follow-up for any recurrent severe pain, fever, or other new symptoms

## 2024-01-13 NOTE — Progress Notes (Signed)
 Established Patient Office Visit  Subjective   Patient ID: Jeffrey [name], male    DOB: Mar 31, 1981  Age: 43 y.o. MRN: [id number]  Chief Complaint  Patient presents with   Abdominal Pain   Emesis   Nausea   Chills    HPI   Jeffrey [name] is seen for acute visit.  Last weekend he was out of town with some friends up in [place] .  Around 9 PM last night after he got back he developed acute fairly severe pain really more left lower back area almost over the iliac crest.  Seem to radiate somewhat toward the scrotal area.  He had some vomiting and chills but no diarrhea.  Feels better this morning.  Kept down half a bagel this morning.  No burning with urination.  No gross hematuria.  No known sick contacts. Denies any epigastric pain.  No anterior lower abdominal pain.  Past Medical History:  Diagnosis Date   Asthma    Past Surgical History:  Procedure Laterality Date   ANTERIOR CRUCIATE LIGAMENT REPAIR Right 2010   TONSILLECTOMY AND ADENOIDECTOMY     as a child    reports that he has never smoked. He has never used smokeless tobacco. He reports current alcohol use. He reports that he does not use drugs. family history includes Cancer in his father; Cirrhosis in his mother; Heart disease in his maternal grandfather, maternal grandmother, paternal grandfather, and paternal grandmother; Heart disease (age of onset: 64) in his father; Hypertension in his father. Allergies  Allergen Reactions   2,4-D Dimethylamine Hives   Phenergan [Promethazine Hcl] Nausea And Vomiting    Review of Systems  Constitutional:  Positive for chills.  Respiratory:  Negative for cough.   Gastrointestinal:  Positive for nausea and vomiting. Negative for blood in stool, diarrhea and melena.  Genitourinary:  Negative for flank pain and hematuria.  Skin:  Negative for rash.      Objective:     BP 110/64 (BP Location: Left Arm, Patient Position: Sitting, Cuff Size: Normal)   Pulse (!) 110    Temp 97.6 F (36.4 C) (Oral)   Wt 187 lb 11.2 oz (85.1 kg)   SpO2 98%   BMI 27.18 kg/m  BP Readings from Last 3 Encounters:  01/13/24 110/64  08/30/23 120/76  05/23/22 118/70   Wt Readings from Last 3 Encounters:  01/13/24 187 lb 11.2 oz (85.1 kg)  08/30/23 201 lb 14.4 oz (91.6 kg)  05/23/22 232 lb 4.8 oz (105.4 kg)      Physical Exam Vitals reviewed.  Constitutional:      General: He is not in acute distress.    Appearance: He is not ill-appearing, toxic-appearing or diaphoretic.  Cardiovascular:     Rate and Rhythm: Normal rate and regular rhythm.  Abdominal:     General: Abdomen is flat. Bowel sounds are normal. There is no distension or abdominal bruit.     Palpations: There is no hepatomegaly or mass.     Tenderness: There is no abdominal tenderness. There is no guarding or rebound.  Neurological:     Mental Status: He is alert.      No results found for any visits on 01/13/24.    The 10-year ASCVD risk score (Arnett DK, et al., 2019) is: 0.5%    Assessment & Plan:   Recent acute onset of left flank region pain which was very intense last night associate with vomiting.  No pain currently.  Urine dipstick reveals blood but  no leukocytes or nitrites.  Given acute onset, severity of symptoms, and hematuria suspect acute kidney stone.  Thankfully he is pain-free currently.  Discussed the following  -Wrote a prescription for Flomax  0.4 mg to start for any recurrent pain - Also wrote for Zofran  8 mg ODT 1 every 8 hours as needed for nausea and limited oxycodone  5 mg 1-2 every 4-6 hours as needed for severe recurrent pain - Follow-up immediately for any fever - Be in touch for any recurrent severe pain.  Consider CT imaging if he has any recurrent pain - Also recommended repeat urinalysis in 2 to 3 weeks to ensure hematuria has cleared  Jeffrey Scarlet, MD

## 2024-01-30 ENCOUNTER — Telehealth: Admitting: Physician Assistant

## 2024-01-30 DIAGNOSIS — N481 Balanitis: Secondary | ICD-10-CM | POA: Diagnosis not present

## 2024-01-31 MED ORDER — FLUCONAZOLE 150 MG PO TABS: 150.0000 mg | ORAL_TABLET | ORAL | 0 refills | Status: AC

## 2024-01-31 MED ORDER — CLOTRIMAZOLE 1 % EX CREA
1.0000 | TOPICAL_CREAM | Freq: Two times a day (BID) | CUTANEOUS | 0 refills | Status: AC
Start: 2024-01-31 — End: ?

## 2024-01-31 NOTE — Progress Notes (Signed)
 E-Visit for [employer]  We are sorry that you are not feeling well. Here is how we plan to help!  Based on what you shared with me it looks like you have tinea cruris, or "Jock Itch".  The symptoms of Jock Itch include red, peeling, itchy rash that affects the groin (crease where the leg meets the trunk).  This fungal infection can be spread through shared towels, clothing, bedding, or hard surfaces (particularly in moist areas) such as shower stalls, locker room floors, or pool area that has the fungus present. If you have a fungal infection on one part of your body, you can also spread it to other parts. For instance, men with a fungal infection on their feet sometimes spread it to their groin.  It actually sounds like you may have Balanitis instead, which is a fungal infection of the head of the penis.   I am recommending:Clotrimazole  1% cream or gel, apply to area twice per day   Prescription medications are only indicated for an extensive rash or if over the counter treatments have failed.  I am prescribing: Fluconazole  150 mg once weekly for two weeks  HOME CARE:  Keep affected area clean, dry, and cool. Wash with soap and shampoo after sports or exercise and dry yourself well after bathing or swimming Wear cotton underwear and change them if they become damp or sweaty. Avoid using swimming pools, public showers, or baths.  GET HELP RIGHT AWAY IF:  Symptoms that don't away after treatment. Severe itching that persists. If your rash spreads or swells. If your rash begins to have drainage or smell. You develop a fever.  MAKE SURE YOU   Understand these instructions. Will watch your condition. Will get help right away if you are not doing well or get worse.  Thank you for choosing an e-visit.  Your e-visit answers were reviewed by a board certified advanced clinical practitioner to complete your personal care plan. Depending upon the condition, your plan could have included  both over the counter or prescription medications.  Please review your pharmacy choice. Make sure the pharmacy is open so you can pick up prescription now. If there is a problem, you may contact your provider through [employer] and have the prescription routed to another pharmacy.  Your safety is important to us . If you have drug allergies check your prescription carefully.   For the next 24 hours you can use MyChart to ask questions about today's visit, request a non-urgent call back, or ask for a work or school excuse. You will get an email in the next two days asking about your experience. I hope that your e-visit has been valuable and will speed your recovery.   References or for more information:  [email or web address]=search_[email or web address]=[email or web address]~52&usage_type=default&display_rank=3    I have spent 5 minutes in review of e-visit questionnaire, review and updating patient chart, medical decision making and response to patient.   Delon CHRISTELLA Dickinson, PA-C

## 2024-02-14 ENCOUNTER — Encounter: Payer: Self-pay | Admitting: Family Medicine

## 2024-02-14 ENCOUNTER — Ambulatory Visit: Admitting: Family Medicine

## 2024-02-14 VITALS — BP 112/60 | HR 75 | Temp 97.5°F | Wt 191.0 lb

## 2024-02-14 DIAGNOSIS — R5383 Other fatigue: Secondary | ICD-10-CM

## 2024-02-14 DIAGNOSIS — R1012 Left upper quadrant pain: Secondary | ICD-10-CM

## 2024-02-14 LAB — CBC WITH DIFFERENTIAL/PLATELET
Basophils Absolute: 0.1 K/uL (ref 0.0–0.1)
Basophils Relative: 1.4 % (ref 0.0–3.0)
Eosinophils Absolute: 0.7 K/uL (ref 0.0–0.7)
Eosinophils Relative: 12 % — ABNORMAL HIGH (ref 0.0–5.0)
HCT: 44.4 % (ref 39.0–52.0)
Hemoglobin: 14.8 g/dL (ref 13.0–17.0)
Lymphocytes Relative: 30.6 % (ref 12.0–46.0)
Lymphs Abs: 1.7 K/uL (ref 0.7–4.0)
MCHC: 33.4 g/dL (ref 30.0–36.0)
MCV: 89.7 fl (ref 78.0–100.0)
Monocytes Absolute: 0.5 K/uL (ref 0.1–1.0)
Monocytes Relative: 9.2 % (ref 3.0–12.0)
Neutro Abs: 2.6 K/uL (ref 1.4–7.7)
Neutrophils Relative %: 46.8 % (ref 43.0–77.0)
Platelets: 253 K/uL (ref 150.0–400.0)
RBC: 4.95 Mil/uL (ref 4.22–5.81)
RDW: 13.8 % (ref 11.5–15.5)
WBC: 5.5 K/uL (ref 4.0–10.5)

## 2024-02-14 LAB — TSH: TSH: 0.69 u[IU]/mL (ref 0.35–5.50)

## 2024-02-14 NOTE — Progress Notes (Signed)
 Established Patient Office Visit  Subjective   Patient ID: Jeffrey [name], male    DOB: 11-29-1980  Age: 43 y.o. MRN: [id number]  Chief Complaint  Patient presents with   Abdominal Pain   Fatigue    HPI   Jeffrey [name] is here with dull pain left flank area.  This is actually more anterior than last visit.  Jeffrey [name] describes this as dull .  Has noted predominantly in certain positions.  Denies any fever, nausea, vomiting, stool changes, or any dysuria.  Appetite has been generally good.  Normal bowel movements.  Jeffrey [name] has noticed some increased malaise.  Jeffrey [name] had recent urine dipstick which did show blood and we had recommended complete future urinalysis.  No gross hematuria..  We suspected possible recent kidney stone but has had no intense pain comparable with last visit and this pain is somewhat different.  Past Medical History:  Diagnosis Date   Asthma    Past Surgical History:  Procedure Laterality Date   ANTERIOR CRUCIATE LIGAMENT REPAIR Right 2010   TONSILLECTOMY AND ADENOIDECTOMY     as a child    reports that Jeffrey [name] has never smoked. Jeffrey [name] has never used smokeless tobacco. Jeffrey [name] reports current alcohol use. Jeffrey [name] reports that Jeffrey [name] does not use drugs. family history includes Cancer in his father; Cirrhosis in his mother; Heart disease in his maternal grandfather, maternal grandmother, paternal grandfather, and paternal grandmother; Heart disease (age of onset: 69) in his father; Hypertension in his father. Allergies  Allergen Reactions   2,4-D Dimethylamine Hives   Phenergan [Promethazine Hcl] Nausea And Vomiting    Review of Systems  Constitutional:  Negative for chills and fever.  Cardiovascular:  Negative for chest pain.  Gastrointestinal:  Positive for abdominal pain. Negative for nausea and vomiting.  Genitourinary:  Negative for dysuria and hematuria.      Objective:     BP 112/60   Pulse 75   Temp (!) 97.5 F (36.4 C) (Oral)   Wt 191 lb (86.6 kg)   SpO2 97%   BMI 27.65 kg/m   BP Readings from Last 3 Encounters:  02/14/24 112/60  01/13/24 110/64  08/30/23 120/76   Wt Readings from Last 3 Encounters:  02/14/24 191 lb (86.6 kg)  01/13/24 187 lb 11.2 oz (85.1 kg)  08/30/23 201 lb 14.4 oz (91.6 kg)      Physical Exam Vitals reviewed.  Constitutional:      General: Jeffrey [name] is not in acute distress.    Appearance: Jeffrey [name] is not ill-appearing.  Cardiovascular:     Rate and Rhythm: Normal rate and regular rhythm.  Abdominal:     Comments: Abdomen nondistended.  Normal bowel sounds.  Soft with no reproducible left upper quadrant tenderness to palpation.  No splenomegaly.  No guarding or rebound.  Neurological:     Mental Status: Jeffrey [name] is alert.      No results found for any visits on 02/14/24.  Last CBC Lab Results  Component Value Date   WBC 5.5 02/14/2024   HGB 14.8 02/14/2024   HCT 44.4 02/14/2024   MCV 89.7 02/14/2024   MCH 30.2 08/30/2023   RDW 13.8 02/14/2024   PLT 253.0 02/14/2024   Last metabolic panel Lab Results  Component Value Date   GLUCOSE 93 08/30/2023   NA 138 08/30/2023   K 4.8 08/30/2023   CL 100 08/30/2023   CO2 30 08/30/2023   BUN 15 08/30/2023   CREATININE 0.68 08/30/2023   GFR 109.69 05/23/2022   CALCIUM 9.7  08/30/2023   PROT 7.2 08/30/2023   ALBUMIN 4.7 05/23/2022   BILITOT 0.5 08/30/2023   ALKPHOS 43 05/23/2022   AST 16 08/30/2023   ALT 10 08/30/2023   ANIONGAP 7 01/04/2018   Last thyroid  functions Lab Results  Component Value Date   TSH 0.69 02/14/2024      The 10-year ASCVD risk score (Arnett DK, et al., 2019) is: 0.5%    Assessment & Plan:   Problem List Items Addressed This Visit   None Visit Diagnoses       Fatigue, unspecified type    -  Primary   Relevant Orders   TSH     Abdominal pain, LUQ       Relevant Orders   CBC with Differential/Platelet   Urinalysis w microscopic + reflex cultur     Left upper quadrant abdominal pain.  Recent symptoms suggesting possible kidney stone-but this pain  seems somewhat different in location..  Current pain is relatively mild.  Denies any red flags such as appetite change, fever, nausea, vomiting, or stool change.  -Obtain further labs with urinalysis with reflex microscopy and culture if indicated - CBC with differential - If above unrevealing and pain persist consider CT imaging.  Will also consider CT if Jeffrey [name] has any persistent hematuria  No follow-ups on file.    Wolm Scarlet, MD

## 2024-02-15 ENCOUNTER — Encounter: Payer: Self-pay | Admitting: Family Medicine

## 2024-02-15 DIAGNOSIS — R5383 Other fatigue: Secondary | ICD-10-CM

## 2024-02-15 LAB — URINALYSIS W MICROSCOPIC + REFLEX CULTURE
Bacteria, UA: NONE SEEN /HPF
Bilirubin Urine: NEGATIVE
Glucose, UA: NEGATIVE
Hgb urine dipstick: NEGATIVE
Hyaline Cast: NONE SEEN /LPF
Leukocyte Esterase: NEGATIVE
Nitrites, Initial: NEGATIVE
Protein, ur: NEGATIVE
RBC / HPF: NONE SEEN /HPF (ref 0–2)
Specific Gravity, Urine: 1.019 (ref 1.001–1.035)
Squamous Epithelial / HPF: NONE SEEN /HPF (ref <=5)
WBC, UA: NONE SEEN /HPF (ref 0–5)
pH: 6.5 (ref 5.0–8.0)

## 2024-02-15 LAB — NO CULTURE INDICATED

## 2024-02-17 ENCOUNTER — Ambulatory Visit: Payer: Self-pay | Admitting: Family Medicine

## 2024-03-13 ENCOUNTER — Ambulatory Visit: Admitting: Family Medicine

## 2024-03-13 ENCOUNTER — Encounter: Payer: Self-pay | Admitting: Family Medicine

## 2024-03-13 VITALS — BP 110/60 | HR 79 | Temp 97.6°F | Wt 189.7 lb

## 2024-03-13 DIAGNOSIS — R5383 Other fatigue: Secondary | ICD-10-CM

## 2024-03-13 NOTE — Progress Notes (Signed)
 Established Patient Office Visit  Subjective   Patient ID: Jeffrey [name], male    DOB: Jul 08, 1980  Age: 43 y.o. MRN: [id number]  Chief Complaint  Patient presents with   Medical Management of Chronic Issues    HPI   [name] is here today to discuss recent labs and symptoms.  He had presented here late August with some dull pain left flank area.  Denied any fever, chills, nausea, vomiting, stool changes, or any dysuria.  Also complained of some nonspecific fatigue.  We obtained CBC which came back normal with exception of mildly elevated eosinophils.  Normal white count and normal hemoglobin.  Urinalysis was unremarkable except for trace ketones.  TSH came back normal range at 0.69.  He feels pretty much back to baseline at this time.  He is exercising regularly.  Has done excellent job of making dietary changes and exercising regularly to help maintain weight.  Past Medical History:  Diagnosis Date   Asthma    Past Surgical History:  Procedure Laterality Date   ANTERIOR CRUCIATE LIGAMENT REPAIR Right 2010   TONSILLECTOMY AND ADENOIDECTOMY     as a child    reports that he has never smoked. He has never used smokeless tobacco. He reports current alcohol use. He reports that he does not use drugs. family history includes Cancer in his father; Cirrhosis in his mother; Heart disease in his maternal grandfather, maternal grandmother, paternal grandfather, and paternal grandmother; Heart disease (age of onset: 48) in his father; Hypertension in his father. Allergies  Allergen Reactions   2,4-D Dimethylamine Hives   Phenergan [Promethazine Hcl] Nausea And Vomiting    Review of Systems  Constitutional:  Negative for chills, fever and malaise/fatigue.  Eyes:  Negative for blurred vision.  Respiratory:  Negative for shortness of breath.   Cardiovascular:  Negative for chest pain.  Gastrointestinal:  Negative for abdominal pain.  Neurological:  Negative for dizziness, weakness and  headaches.      Objective:     BP 110/60   Pulse 79   Temp 97.6 F (36.4 C) (Oral)   Wt 189 lb 11.2 oz (86 kg)   SpO2 95%   BMI 27.47 kg/m  BP Readings from Last 3 Encounters:  03/13/24 110/60  02/14/24 112/60  01/13/24 110/64   Wt Readings from Last 3 Encounters:  03/13/24 189 lb 11.2 oz (86 kg)  02/14/24 191 lb (86.6 kg)  01/13/24 187 lb 11.2 oz (85.1 kg)      Physical Exam Vitals reviewed.  Constitutional:      General: He is not in acute distress.    Appearance: He is not ill-appearing.  Cardiovascular:     Rate and Rhythm: Normal rate and regular rhythm.     Heart sounds: No murmur heard. Pulmonary:     Effort: Pulmonary effort is normal.     Breath sounds: Normal breath sounds.  Neurological:     Mental Status: He is alert.      No results found for any visits on 03/13/24.  Last CBC Lab Results  Component Value Date   WBC 5.5 02/14/2024   HGB 14.8 02/14/2024   HCT 44.4 02/14/2024   MCV 89.7 02/14/2024   MCH 30.2 08/30/2023   RDW 13.8 02/14/2024   PLT 253.0 02/14/2024   Last metabolic panel Lab Results  Component Value Date   GLUCOSE 93 08/30/2023   NA 138 08/30/2023   K 4.8 08/30/2023   CL 100 08/30/2023   CO2 30 08/30/2023  BUN 15 08/30/2023   CREATININE 0.68 08/30/2023   GFR 109.69 05/23/2022   CALCIUM 9.7 08/30/2023   PROT 7.2 08/30/2023   ALBUMIN 4.7 05/23/2022   BILITOT 0.5 08/30/2023   ALKPHOS 43 05/23/2022   AST 16 08/30/2023   ALT 10 08/30/2023   ANIONGAP 7 01/04/2018   Last thyroid  functions Lab Results  Component Value Date   TSH 0.69 02/14/2024      The 10-year ASCVD risk score (Arnett DK, et al., 2019) is: 0.5%    Assessment & Plan:   Recent fatigue and left upper quadrant/left flank pain.  Recent labs unrevealing.  Symptoms have basically resolved at this time.  Observe for now.  Continue regular exercise habits.  Follow-up for any recurrent abdominal pain or other concerns   Wolm Scarlet, MD

## 2024-03-16 MED ORDER — AMPHETAMINE-DEXTROAMPHET ER 30 MG PO CP24: 30.0000 mg | ORAL_CAPSULE | ORAL | 0 refills | Status: DC

## 2024-03-16 NOTE — Addendum Note (Signed)
 Addended by: METTA KRISTEN CROME on: 03/16/2024 07:54 AM   Modules accepted: Orders

## 2024-03-16 NOTE — Addendum Note (Signed)
 Addended by: MICHEAL WOLM ORN on: 03/16/2024 12:59 PM   Modules accepted: Orders

## 2024-03-23 ENCOUNTER — Telehealth: Admitting: Physician Assistant

## 2024-03-23 DIAGNOSIS — B9689 Other specified bacterial agents as the cause of diseases classified elsewhere: Secondary | ICD-10-CM

## 2024-03-23 DIAGNOSIS — J208 Acute bronchitis due to other specified organisms: Secondary | ICD-10-CM

## 2024-03-23 MED ORDER — BENZONATATE 100 MG PO CAPS: 100.0000 mg | ORAL_CAPSULE | Freq: Three times a day (TID) | ORAL | 0 refills | Status: AC | PRN

## 2024-03-23 MED ORDER — AZITHROMYCIN 250 MG PO TABS: ORAL_TABLET | ORAL | 0 refills | Status: AC

## 2024-03-23 NOTE — Progress Notes (Signed)
 We are sorry that you are not feeling well.  Here is how we plan to help!  Based on your presentation I believe you most likely have A cough due to bacteria.  When patients have a fever and a productive cough with a change in color or increased sputum production, we are concerned about bacterial bronchitis.  If left untreated it can progress to pneumonia.  If your symptoms do not improve with your treatment plan it is important that you contact your provider.   I have prescribed Azithromyin 250 mg: two tablets now and then one tablet daily for 4 additonal days    In addition you may use A non-prescription cough medication called Mucinex DM: take 2 tablets every 12 hours. and A prescription cough medication called Tessalon  Perles 100mg . You may take 1-2 capsules every 8 hours as needed for your cough.  From your responses in the eVisit questionnaire you describe inflammation in the upper respiratory tract which is causing a significant cough.  This is commonly called Bronchitis and has four common causes:   Allergies Viral Infections Acid Reflux Bacterial Infection Allergies, viruses and acid reflux are treated by controlling symptoms or eliminating the cause. An example might be a cough caused by taking certain blood pressure medications. You stop the cough by changing the medication. Another example might be a cough caused by acid reflux. Controlling the reflux helps control the cough.  USE OF BRONCHODILATOR (RESCUE) INHALERS: There is a risk from using your bronchodilator too frequently.  The risk is that over-reliance on a medication which only relaxes the muscles surrounding the breathing tubes can reduce the effectiveness of medications prescribed to reduce swelling and congestion of the tubes themselves.  Although you feel brief relief from the bronchodilator inhaler, your asthma may actually be worsening with the tubes becoming more swollen and filled with mucus.  This can delay other crucial  treatments, such as oral steroid medications. If you need to use a bronchodilator inhaler daily, several times per day, you should discuss this with your provider.  There are probably better treatments that could be used to keep your asthma under control.     HOME CARE Only take medications as instructed by your medical team. Complete the entire course of an antibiotic. Drink plenty of fluids and get plenty of rest. Avoid close contacts especially the very young and the elderly Cover your mouth if you cough or cough into your sleeve. Always remember to wash your hands A steam or ultrasonic humidifier can help congestion.   GET HELP RIGHT AWAY IF: You develop worsening fever. You become short of breath You cough up blood. Your symptoms persist after you have completed your treatment plan MAKE SURE YOU  Understand these instructions. Will watch your condition. Will get help right away if you are not doing well or get worse.  Your e-visit answers were reviewed by a board certified advanced clinical practitioner to complete your personal care plan.  Depending on the condition, your plan could have included both over the counter or prescription medications. If there is a problem please reply  once you have received a response from your provider. Your safety is important to us .  If you have drug allergies check your prescription carefully.    You can use MyChart to ask questions about today's visit, request a non-urgent call back, or ask for a work or school excuse for 24 hours related to this e-Visit. If it has been greater than 24 hours you  will need to follow up with your provider, or enter a new e-Visit to address those concerns. You will get an e-mail in the next two days asking about your experience.  I hope that your e-visit has been valuable and will speed your recovery. Thank you for using e-visits.   I have spent 5 minutes in review of e-visit questionnaire, review and updating  patient chart, medical decision making and response to patient.   Delon CHRISTELLA Dickinson, PA-C

## 2024-04-15 ENCOUNTER — Encounter: Payer: Self-pay | Admitting: Family Medicine

## 2024-04-15 MED ORDER — AMPHETAMINE-DEXTROAMPHET ER 30 MG PO CP24: 30.0000 mg | ORAL_CAPSULE | ORAL | 0 refills | Status: AC

## 2024-04-15 NOTE — Telephone Encounter (Signed)
 I sent these to [facility].  They had previously been sent to [facility] [address].  Would be very helpful if we can simplify and limit the number of pharmacies listed--- to help minimize risk of things being sent to the wrong pharmacy  Wolm LELON Scarlet MD [facility]

## 2024-05-16 ENCOUNTER — Encounter: Payer: Self-pay | Admitting: Family Medicine

## 2024-05-18 MED ORDER — ALBUTEROL SULFATE HFA 108 (90 BASE) MCG/ACT IN AERS: 1.0000 | INHALATION_SPRAY | Freq: Four times a day (QID) | RESPIRATORY_TRACT | 2 refills | Status: AC | PRN

## 2024-05-22 DIAGNOSIS — F411 Generalized anxiety disorder: Secondary | ICD-10-CM | POA: Diagnosis not present
